# Patient Record
Sex: Female | Born: 1965
Health system: Southern US, Community
[De-identification: ages and names within clinical notes are randomized; demographics above are authoritative.]

## PROBLEM LIST (undated history)

## (undated) DIAGNOSIS — Z8742 Personal history of other diseases of the female genital tract: Secondary | ICD-10-CM

## (undated) DIAGNOSIS — I10 Essential (primary) hypertension: Secondary | ICD-10-CM

## (undated) DIAGNOSIS — E7800 Pure hypercholesterolemia, unspecified: Secondary | ICD-10-CM

## (undated) HISTORY — DX: Personal history of other diseases of the female genital tract: Z87.42

## (undated) HISTORY — DX: Essential (primary) hypertension: I10

## (undated) HISTORY — DX: Pure hypercholesterolemia, unspecified: E78.00

## (undated) HISTORY — PX: COLPOSCOPY: SHX161

---

## 1999-01-25 ENCOUNTER — Other Ambulatory Visit: Admission: RE | Admit: 1999-01-25 | Discharge: 1999-01-25 | Payer: Self-pay | Admitting: Family Medicine

## 2000-03-01 ENCOUNTER — Other Ambulatory Visit: Admission: RE | Admit: 2000-03-01 | Discharge: 2000-03-01 | Payer: Self-pay | Admitting: Gynecology

## 2001-01-29 ENCOUNTER — Other Ambulatory Visit: Admission: RE | Admit: 2001-01-29 | Discharge: 2001-01-29 | Payer: Self-pay | Admitting: Obstetrics and Gynecology

## 2002-02-02 ENCOUNTER — Other Ambulatory Visit: Admission: RE | Admit: 2002-02-02 | Discharge: 2002-02-02 | Payer: Self-pay | Admitting: Obstetrics and Gynecology

## 2003-02-19 ENCOUNTER — Other Ambulatory Visit: Admission: RE | Admit: 2003-02-19 | Discharge: 2003-02-19 | Payer: Self-pay | Admitting: Obstetrics and Gynecology

## 2004-02-22 ENCOUNTER — Other Ambulatory Visit: Admission: RE | Admit: 2004-02-22 | Discharge: 2004-02-22 | Payer: Self-pay | Admitting: Obstetrics and Gynecology

## 2004-08-10 ENCOUNTER — Other Ambulatory Visit: Admission: RE | Admit: 2004-08-10 | Discharge: 2004-08-10 | Payer: Self-pay | Admitting: Obstetrics and Gynecology

## 2005-03-06 ENCOUNTER — Other Ambulatory Visit: Admission: RE | Admit: 2005-03-06 | Discharge: 2005-03-06 | Payer: Self-pay | Admitting: Obstetrics and Gynecology

## 2005-08-30 ENCOUNTER — Other Ambulatory Visit: Admission: RE | Admit: 2005-08-30 | Discharge: 2005-08-30 | Payer: Self-pay | Admitting: Obstetrics and Gynecology

## 2015-08-19 ENCOUNTER — Other Ambulatory Visit: Payer: Self-pay | Admitting: Obstetrics and Gynecology

## 2015-08-19 DIAGNOSIS — R928 Other abnormal and inconclusive findings on diagnostic imaging of breast: Secondary | ICD-10-CM

## 2015-08-29 ENCOUNTER — Ambulatory Visit
Admission: RE | Admit: 2015-08-29 | Discharge: 2015-08-29 | Disposition: A | Discharge status: Home / Self Care | Payer: 59 | Source: Ambulatory Visit | Attending: Obstetrics and Gynecology | Admitting: Obstetrics and Gynecology

## 2015-08-29 DIAGNOSIS — R928 Other abnormal and inconclusive findings on diagnostic imaging of breast: Secondary | ICD-10-CM

## 2018-09-17 ENCOUNTER — Encounter: Payer: Self-pay | Admitting: Obstetrics and Gynecology

## 2018-09-17 ENCOUNTER — Other Ambulatory Visit: Payer: Self-pay | Admitting: Obstetrics and Gynecology

## 2018-09-17 DIAGNOSIS — Z1231 Encounter for screening mammogram for malignant neoplasm of breast: Secondary | ICD-10-CM

## 2018-10-07 ENCOUNTER — Other Ambulatory Visit (HOSPITAL_COMMUNITY)
Admission: RE | Admit: 2018-10-07 | Discharge: 2018-10-07 | Disposition: A | Discharge status: Home / Self Care | Payer: 59 | Source: Ambulatory Visit | Attending: Obstetrics and Gynecology | Admitting: Obstetrics and Gynecology

## 2018-10-07 ENCOUNTER — Encounter: Payer: Self-pay | Admitting: Obstetrics and Gynecology

## 2018-10-07 ENCOUNTER — Other Ambulatory Visit: Payer: Self-pay

## 2018-10-07 ENCOUNTER — Ambulatory Visit (INDEPENDENT_AMBULATORY_CARE_PROVIDER_SITE_OTHER): Payer: 59 | Admitting: Obstetrics and Gynecology

## 2018-10-07 VITALS — BP 124/80 | HR 84 | Resp 16 | Ht 65.0 in | Wt 276.0 lb

## 2018-10-07 DIAGNOSIS — Z01419 Encounter for gynecological examination (general) (routine) without abnormal findings: Secondary | ICD-10-CM | POA: Insufficient documentation

## 2018-10-07 DIAGNOSIS — N951 Menopausal and female climacteric states: Secondary | ICD-10-CM | POA: Diagnosis not present

## 2018-10-07 DIAGNOSIS — Z113 Encounter for screening for infections with a predominantly sexual mode of transmission: Secondary | ICD-10-CM | POA: Insufficient documentation

## 2018-10-07 DIAGNOSIS — R928 Other abnormal and inconclusive findings on diagnostic imaging of breast: Secondary | ICD-10-CM

## 2018-10-07 DIAGNOSIS — Z23 Encounter for immunization: Secondary | ICD-10-CM

## 2018-10-07 DIAGNOSIS — E785 Hyperlipidemia, unspecified: Secondary | ICD-10-CM

## 2018-10-07 NOTE — Patient Instructions (Signed)

## 2018-10-07 NOTE — Progress Notes (Signed)
52 y.o. G0P0000 Single African American female here as a prior patient of Dr. Edward JollySilva for an annual exam.    LMP 07/13/18. Prior LMP was July 2019.  Some hot flashes.  Essential oils help this.  Would like STD screening.  RLQ pain at hs, not during the day.  Not associated with anything.  Normal bowel movements.   PCP:  No PCP.  Patient's last menstrual period was 07/13/2018.     Period Cycle (Days): (no period since September 2019) Period Duration (Days): 4 Period Pattern: (!) Irregular Menstrual Flow: Light, Moderate Menstrual Control: Maxi pad Menstrual Control Change Freq (Hours): 2-3 Dysmenorrhea: None     Sexually active: Yes.    The current method of family planning is none.    Exercising: Yes.    cardio Smoker:  no  Health Maintenance: Pap:  09/17/17 Normal History of abnormal Pap:  Yes, per patient about 3 years ago with Dr. Claiborne Billingsallahan [facility]; had Colposcopy done, normal since MMG:  09[place] per patient due.    She was due for a follow up mammogram in June, 2019 as a dx follow up, and she did not do this.   Colonoscopy:  About 3 years ago per patient polyps removed, f/u 10 years - Dr. Loreta AveMann BMD:   n/a  Result  n/a TDaP:  Unsure possibly more than 10 years.   Gardasil:   no HIV: negative in the past Hep C: never Screening Labs:  Discuss today    reports that she has never smoked. She has never used smokeless tobacco. She reports that she drinks alcohol. She reports that she does not use drugs.  Past Medical History:  Diagnosis Date  . High cholesterol   . History of abnormal cervical Pap smear   . Hypertension     Past Surgical History:  Procedure Laterality Date  . COLPOSCOPY      Current Outpatient Medications  Medication Sig Dispense Refill  . amLODIPine Besylate (NORVASC PO) Take 5 mg by mouth daily.    . naproxen (NAPROSYN) 500 MG tablet naproxen 500 mg tablet  Take 1 tablet twice a day by oral route as needed.      No current facility-administered medications for this visit.     Family History  Problem Relation Age of Onset  . Hyperlipidemia Mother   . Hypertension Mother   . Congestive Heart Failure Father   . Autoimmune disease Sister   . Hypertension Brother   . Dementia Maternal Grandmother   . Colon cancer Maternal Grandfather   . Hypertension Brother   . Hyperlipidemia Brother     Review of Systems  Constitutional: Negative.   HENT: Negative.   Eyes: Negative.   Respiratory: Negative.   Cardiovascular: Negative.   Gastrointestinal: Negative.   Endocrine: Negative.   Genitourinary: Negative.   Musculoskeletal: Negative.   Skin: Negative.   Allergic/Immunologic: Negative.   Neurological: Negative.   Hematological: Negative.   Psychiatric/Behavioral: Negative.     Exam:   BP 124/80 (BP Location: Right Arm, Patient Position: Sitting, Cuff Size: Large)   Pulse 84   Resp 16   Ht 5\' 5"  (1.651 m)   Wt 276 lb (125.2 kg)   LMP 07/13/2018   BMI 45.93 kg/m     General appearance: alert, cooperative and appears stated age Head: Normocephalic, without obvious abnormality, atraumatic Neck: no adenopathy, supple, symmetrical, trachea midline and thyroid normal to inspection and palpation Lungs: clear to auscultation bilaterally Breasts:  normal appearance, no masses or tenderness, No nipple retraction or dimpling, No nipple discharge or bleeding, No axillary or supraclavicular adenopathy Heart: regular rate and rhythm Abdomen: soft, non-tender; no masses, no organomegaly Extremities: extremities normal, atraumatic, no cyanosis or edema Skin: Skin color, texture, turgor normal. No rashes or lesions Lymph nodes: Cervical, supraclavicular, and axillary nodes normal. No abnormal inguinal nodes palpated Neurologic: Grossly normal  Pelvic: External genitalia:  no lesions              Urethra:  normal appearing urethra with no masses, tenderness or lesions              Bartholins and  Skenes: normal                 Vagina: normal appearing vagina with normal color and discharge, no lesions              Cervix: no lesions              Pap taken: Yes.   Bimanual Exam:  Uterus:  normal size, contour, position, consistency, mobility, non-tender              Adnexa: no mass, fullness, tenderness              Rectal exam: Yes.  .  Confirms.              Anus:  normal sphincter tone, no lesions  Chaperone was present for exam.  Assessment:   Well woman visit with normal exam. Hx abnormal pap.  Menopausal symptoms. STD screening.  Hx abnormal mammogram in [place], overdue for follow up.   Plan: Mammogram screening.  May need dx imaging at [facility].  Recommended self breast awareness. Pap and HR HPV as above. Guidelines for Calcium, Vitamin D, regular exercise program including cardiovascular and weight bearing exercise. TDap. Flu vaccine discussed.  Routine labs and STD screening.  She will establish care with PCP.  She is going to need refills of her meds in the next couple of months. Follow up annually and prn.   After visit summary provided.

## 2018-10-07 NOTE — Progress Notes (Signed)
Opened in error

## 2018-10-08 LAB — HEPATITIS C ANTIBODY: Hep C Virus Ab: <0.1 s/co ratio (ref 0.0–0.9)

## 2018-10-08 LAB — LIPID PANEL
Chol/HDL Ratio: 4.5 ratio — ABNORMAL HIGH (ref 0.0–4.4)
Cholesterol, Total: 300 mg/dL — ABNORMAL HIGH (ref 100–199)
HDL: 67 mg/dL (ref >39)
LDL Calculated: 220 mg/dL — ABNORMAL HIGH (ref 0–99)
Triglycerides: 64 mg/dL (ref 0–149)
VLDL Cholesterol Cal: 13 mg/dL (ref 5–40)

## 2018-10-08 LAB — COMPREHENSIVE METABOLIC PANEL
ALT: 7 IU/L (ref 0–32)
AST: 15 IU/L (ref 0–40)
Albumin/Globulin Ratio: 1.4 (ref 1.2–2.2)
Albumin: 4.3 g/dL (ref 3.5–5.5)
Alkaline Phosphatase: 103 IU/L (ref 39–117)
BUN/Creatinine Ratio: 10 (ref 9–23)
BUN: 9 mg/dL (ref 6–24)
Bilirubin Total: 0.5 mg/dL (ref 0.0–1.2)
CO2: 21 mmol/L (ref 20–29)
Calcium: 9.5 mg/dL (ref 8.7–10.2)
Chloride: 105 mmol/L (ref 96–106)
Creatinine, Ser: 0.89 mg/dL (ref 0.57–1.00)
GFR calc Af Amer: 86 mL/min/{1.73_m2} (ref >59)
GFR calc non Af Amer: 74 mL/min/{1.73_m2} (ref >59)
Globulin, Total: 3.1 g/dL (ref 1.5–4.5)
Glucose: 89 mg/dL (ref 65–99)
Potassium: 4.2 mmol/L (ref 3.5–5.2)
Sodium: 141 mmol/L (ref 134–144)
Total Protein: 7.4 g/dL (ref 6.0–8.5)

## 2018-10-08 LAB — CBC
Hematocrit: 34.5 % (ref 34.0–46.6)
Hemoglobin: 11.5 g/dL (ref 11.1–15.9)
MCH: 28 pg (ref 26.6–33.0)
MCHC: 33.3 g/dL (ref 31.5–35.7)
MCV: 84 fL (ref 79–97)
Platelets: 274 10*3/uL (ref 150–450)
RBC: 4.1 x10E6/uL (ref 3.77–5.28)
RDW: 14.1 % (ref 12.3–15.4)
WBC: 5.2 10*3/uL (ref 3.4–10.8)

## 2018-10-08 LAB — ESTRADIOL: Estradiol: 19.1 pg/mL

## 2018-10-08 LAB — HEP, RPR, HIV PANEL
HIV Screen 4th Generation wRfx: NONREACTIVE
Hepatitis B Surface Ag: NEGATIVE
RPR Ser Ql: NONREACTIVE

## 2018-10-08 LAB — TSH: TSH: 1.44 u[IU]/mL (ref 0.450–4.500)

## 2018-10-08 LAB — FOLLICLE STIMULATING HORMONE: FSH: 48.2 m[IU]/mL

## 2018-10-09 ENCOUNTER — Telehealth: Payer: Self-pay | Admitting: *Deleted

## 2018-10-09 DIAGNOSIS — E785 Hyperlipidemia, unspecified: Secondary | ICD-10-CM | POA: Insufficient documentation

## 2018-10-09 NOTE — Telephone Encounter (Signed)
-----   Message from Patton SallesBrook E Amundson C Silva, MD sent at 10/09/2018  2:29 PM EST ----- Please contact patient with results from her lab work.   Her hormonal testing indicates menopausal hormonal levels.  Her last menstrual cycle was September, 2019.  Please have her call if she has future bleeding.   Her cholesterol level is quite high.  Her total cholesterol is 300 and her LDL cholesterol, the bad cholesterol, is 220.  She is at increased risk of cardiovascular disease, and needs to follow up with a PCP.  I would like for us to make a referral for her to a group of her choice.  If she does not have a preference, please refer her to a [facility] group so they can see the results here.   Her metabolic profile, CBC, and TSH are all normal.   Testing is negative for HIV, syphilis, and hep B and C.   Her pap and cervical infection check are not back yet.

## 2018-10-09 NOTE — Telephone Encounter (Signed)
Notes recorded by Leda MinHamm, Vangie Henthorn N, RN on 10/09/2018 at 3:16 PM EST Left message to call Noreene LarssonJill, RN at [facility] [phone].

## 2018-10-10 LAB — CYTOLOGY - PAP
Chlamydia: NEGATIVE
Diagnosis: NEGATIVE
HPV: NOT DETECTED
Neisseria Gonorrhea: NEGATIVE
Trichomonas: NEGATIVE

## 2018-10-14 NOTE — Telephone Encounter (Signed)
Left message to call Noreene LarssonJill, RN at [facility] [phone].   See all results notes below

## 2018-10-14 NOTE — Telephone Encounter (Signed)
Notes recorded by Patton SallesAmundson C Silva, Brook E, MD on 10/13/2018 at 8:54 PM EST Please inform patient of her pap and STD testing.  Her pap is normal and she has a negative HR HPV result. Pap recall - 02.  Testing is negative for gonorrhea, chlamydia, and trichomonas.

## 2018-10-14 NOTE — Telephone Encounter (Signed)
Spoke with patient, advised of all results as seen below per Dr. Edward JollySilva. Patient is scheduled for OV today to establish care with PCP/ [facility]ana Collins at [facility]. Copy of labs dated 10/07/18 faxed via Epic to Dr. Thomasena Edisollins. Patient verbalizes understanding and is agreeable.   Next AEX 10/16/19  Encounter closed.

## 2018-11-07 ENCOUNTER — Other Ambulatory Visit: Payer: 59

## 2018-11-13 ENCOUNTER — Other Ambulatory Visit: Payer: 59

## 2018-11-27 ENCOUNTER — Ambulatory Visit: Payer: 59

## 2018-11-27 ENCOUNTER — Ambulatory Visit
Admission: RE | Admit: 2018-11-27 | Discharge: 2018-11-27 | Disposition: A | Discharge status: Home / Self Care | Payer: 59 | Source: Ambulatory Visit | Attending: Obstetrics and Gynecology | Admitting: Obstetrics and Gynecology

## 2018-11-27 DIAGNOSIS — R928 Other abnormal and inconclusive findings on diagnostic imaging of breast: Secondary | ICD-10-CM

## 2018-12-22 ENCOUNTER — Telehealth: Payer: Self-pay | Admitting: Obstetrics and Gynecology

## 2018-12-22 NOTE — Telephone Encounter (Signed)
Return call from patient. Has concerns over coding for mammogram.  Advised will contact [facility] on her behalf and have business office call her back.   Encounter closed.

## 2018-12-22 NOTE — Telephone Encounter (Signed)
Patient has questions about her mammogram which was coded as diagnostic.

## 2019-08-25 ENCOUNTER — Other Ambulatory Visit: Payer: Self-pay | Admitting: Obstetrics and Gynecology

## 2019-08-25 DIAGNOSIS — Z1231 Encounter for screening mammogram for malignant neoplasm of breast: Secondary | ICD-10-CM

## 2019-10-13 NOTE — Progress Notes (Signed)
53 y.o. G0P0000 Single African American female here for annual exam.    Hot flashes once in a while, but not recently.  Had a light cycle in February, and then a normal period in August, 2020.   She would like STD screening.   Working from home since prior to the pandemic.   PCP:   Irena Reichmann   LMP 05/30/2019           Sexually active: No.  The current method of family planning is none.    Exercising: Yes.    Walking Smoker:  no  Health Maintenance: Pap:  09/17/17 Normal  History of abnormal Pap:  yes per patient about 3 years ago with Dr. Claiborne Billings at [facility]; had Colposcopy done, normal since MMG:  11/27/18 Birads 1 neg  Colonoscopy:  About 3 years ago per patient polyps removed, f/u 10 years - Dr. Loreta Ave BMD:   NA  Result  NA TDaP:  10/07/2018 Gardasil:   no HIV:Neg in the past  Hep C: never Screening Labs:  Hb today: PCP, Urine today: none collected  Flu vaccine:  Recommended.    reports that she has never smoked. She has never used smokeless tobacco. She reports current alcohol use. She reports that she does not use drugs.  Past Medical History:  Diagnosis Date  . High cholesterol   . History of abnormal cervical Pap smear   . Hypertension     Past Surgical History:  Procedure Laterality Date  . COLPOSCOPY      Current Outpatient Medications  Medication Sig Dispense Refill  . amLODIPine Besylate (NORVASC PO) Take 5 mg by mouth daily.    . naproxen (NAPROSYN) 500 MG tablet naproxen 500 mg tablet  Take 1 tablet twice a day by oral route as needed.    . medroxyPROGESTERone (PROVERA) 10 MG tablet Take 1 tablet (10 mg total) by mouth daily. 10 tablet 0   No current facility-administered medications for this visit.    Family History  Problem Relation Age of Onset  . Hyperlipidemia Mother   . Hypertension Mother   . Congestive Heart Failure Father   . Autoimmune disease Sister   . Hypertension Brother   . Dementia Maternal Grandmother   . Colon  cancer Maternal Grandfather   . Hypertension Brother   . Hyperlipidemia Brother     Review of Systems  All other systems reviewed and are negative.   Exam:   BP 128/70   Pulse 78   Temp (!) 97.2 F (36.2 C)   Ht 5' 5.5" (1.664 m)   Wt 286 lb 6.4 oz (129.9 kg)   LMP 05/30/2019 (Approximate)   SpO2 98%   BMI 46.93 kg/m     General appearance: alert, cooperative and appears stated age Head: normocephalic, without obvious abnormality, atraumatic Neck: no adenopathy, supple, symmetrical, trachea midline and thyroid normal to inspection and palpation Lungs: clear to auscultation bilaterally Breasts: normal appearance, no masses or tenderness, No nipple retraction or dimpling, No nipple discharge or bleeding, No axillary adenopathy Heart: regular rate and rhythm Abdomen: soft, non-tender; no masses, no organomegaly Extremities: extremities normal, atraumatic, no cyanosis or edema Skin: skin color, texture, turgor normal. No rashes or lesions Lymph nodes: cervical, supraclavicular, and axillary nodes normal. Neurologic: grossly normal  Pelvic: External genitalia:  no lesions              No abnormal inguinal nodes palpated.  Urethra:  normal appearing urethra with no masses, tenderness or lesions              Bartholins and Skenes: normal                 Vagina: normal appearing vagina with normal color and discharge, no lesions              Cervix: no lesions              Pap taken: Yes.   Bimanual Exam:  Uterus:  normal size, contour, position, consistency, mobility, non-tender              Adnexa: no mass, fullness, tenderness              Rectal exam: Yes.  .  Confirms.              Anus:  normal sphincter tone, no lesions  Chaperone was present for exam.  Assessment:   Well woman visit with normal exam. Hx abnormal pap.  Perimenopausal female. Elevated cholesterol.  Plan: Mammogram screening discussed. Self breast awareness reviewed. Pap and HR HPV as  above. Guidelines for Calcium, Vitamin D, regular exercise program including cardiovascular and weight bearing exercise. Provera 10 mg x 10 days.  Discussed rationale. STD screening.  Routine labs with PCP.  Follow up annually and prn.   After visit summary provided.

## 2019-10-14 ENCOUNTER — Other Ambulatory Visit: Payer: Self-pay

## 2019-10-16 ENCOUNTER — Encounter: Payer: Self-pay | Admitting: Obstetrics and Gynecology

## 2019-10-16 ENCOUNTER — Other Ambulatory Visit (HOSPITAL_COMMUNITY)
Admission: RE | Admit: 2019-10-16 | Discharge: 2019-10-16 | Disposition: A | Discharge status: Home / Self Care | Payer: 59 | Source: Ambulatory Visit | Attending: Obstetrics and Gynecology | Admitting: Obstetrics and Gynecology

## 2019-10-16 ENCOUNTER — Other Ambulatory Visit: Payer: Self-pay

## 2019-10-16 ENCOUNTER — Ambulatory Visit (INDEPENDENT_AMBULATORY_CARE_PROVIDER_SITE_OTHER): Payer: 59 | Admitting: Obstetrics and Gynecology

## 2019-10-16 ENCOUNTER — Encounter (INDEPENDENT_AMBULATORY_CARE_PROVIDER_SITE_OTHER): Payer: Self-pay

## 2019-10-16 VITALS — BP 128/70 | HR 78 | Temp 97.2°F | Ht 65.5 in | Wt 286.4 lb

## 2019-10-16 DIAGNOSIS — Z01419 Encounter for gynecological examination (general) (routine) without abnormal findings: Secondary | ICD-10-CM | POA: Insufficient documentation

## 2019-10-16 DIAGNOSIS — Z113 Encounter for screening for infections with a predominantly sexual mode of transmission: Secondary | ICD-10-CM | POA: Insufficient documentation

## 2019-10-16 MED ORDER — MEDROXYPROGESTERONE ACETATE 10 MG PO TABS: 10.0000 mg | ORAL_TABLET | Freq: Every day | ORAL | 0 refills | Status: DC

## 2019-10-16 NOTE — Patient Instructions (Signed)

## 2019-10-17 LAB — HEPATITIS C ANTIBODY: Hep C Virus Ab: <0.1 s/co ratio (ref 0.0–0.9)

## 2019-10-17 LAB — HEP, RPR, HIV PANEL
HIV Screen 4th Generation wRfx: NONREACTIVE
Hepatitis B Surface Ag: NEGATIVE
RPR Ser Ql: NONREACTIVE

## 2019-10-20 LAB — CYTOLOGY - PAP
Adequacy: ABSENT
Chlamydia: NEGATIVE
Comment: NEGATIVE
Comment: NEGATIVE
Comment: NEGATIVE
Comment: NORMAL
Diagnosis: NEGATIVE
High risk HPV: NEGATIVE
Neisseria Gonorrhea: NEGATIVE
Trichomonas: NEGATIVE

## 2019-10-21 ENCOUNTER — Telehealth: Payer: Self-pay

## 2019-10-21 NOTE — Telephone Encounter (Signed)
-----   Message from Nunzio Cobbs, MD sent at 10/21/2019  9:49 AM EST ----- Please contact patient with results of testing. Her pap is normal and she has a negative HR HPV result. She tested negative for gonorrhea, chlamydia, and trichomonas.  Please place her in 36 month recall.  She had a history of an abnormal pap in recent years at an outside office and had a normal colposcopy result.   I am highlighting this result so you know to contact the patient.

## 2019-10-21 NOTE — Telephone Encounter (Signed)
Called patient and per DPR, left detailed message stating pap & HR HPV normal/neg. Also stated all labs were completely normal. Advised to call office if she had any questions.  36 recall entered

## 2019-11-30 ENCOUNTER — Ambulatory Visit
Admission: RE | Admit: 2019-11-30 | Discharge: 2019-11-30 | Disposition: A | Discharge status: Home / Self Care | Payer: 59 | Source: Ambulatory Visit | Attending: Obstetrics and Gynecology | Admitting: Obstetrics and Gynecology

## 2019-11-30 ENCOUNTER — Other Ambulatory Visit: Payer: Self-pay

## 2019-11-30 DIAGNOSIS — Z1231 Encounter for screening mammogram for malignant neoplasm of breast: Secondary | ICD-10-CM

## 2019-12-31 ENCOUNTER — Ambulatory Visit: Payer: 59 | Attending: Family

## 2019-12-31 ENCOUNTER — Other Ambulatory Visit: Payer: Self-pay

## 2019-12-31 DIAGNOSIS — Z23 Encounter for immunization: Secondary | ICD-10-CM | POA: Insufficient documentation

## 2019-12-31 NOTE — Progress Notes (Signed)
   Covid-19 Vaccination Clinic  Name:  Anna Porter    MRN: [id number] DOB: 1966-03-29  12/31/2019  [name] was observed post Covid-19 immunization for 15 minutes without incident. She was provided with Vaccine Information Sheet and instruction to access the V-Safe system.   [name] was instructed to call 911 with any severe reactions post vaccine: Marland Kitchen Difficulty breathing  . Swelling of face and throat  . A fast heartbeat  . A bad rash all over body  . Dizziness and weakness   Immunizations Administered    Name Date Dose VIS Date Route   Moderna COVID-19 Vaccine 12/31/2019 11:26 AM 0.5 mL 09/29/2019 Intramuscular   Manufacturer: Moderna   Lot: [id number]   NDC: 94801-655-37

## 2020-02-02 ENCOUNTER — Ambulatory Visit: Payer: 59 | Attending: Family

## 2020-02-02 DIAGNOSIS — Z23 Encounter for immunization: Secondary | ICD-10-CM

## 2020-02-02 NOTE — Progress Notes (Signed)
   Covid-19 Vaccination Clinic  Name:  Anna Porter    MRN: [id number] DOB: 12/05/1965  02/02/2020  Anna Porter was observed post Covid-19 immunization for 15 minutes without incident. She was provided with Vaccine Information Sheet and instruction to access the V-Safe system.   Anna Porter was instructed to call 911 with any severe reactions post vaccine: Marland Kitchen Difficulty breathing  . Swelling of face and throat  . A fast heartbeat  . A bad rash all over body  . Dizziness and weakness   Immunizations Administered    Name Date Dose VIS Date Route   Moderna COVID-19 Vaccine 02/02/2020 11:42 AM 0.5 mL 09/29/2019 Intramuscular   Manufacturer: Moderna   Lot: [id number]   NDC: 80321-224-82

## 2020-08-30 ENCOUNTER — Ambulatory Visit: Payer: 59 | Attending: Internal Medicine

## 2020-08-30 DIAGNOSIS — Z23 Encounter for immunization: Secondary | ICD-10-CM

## 2020-08-30 NOTE — Progress Notes (Signed)
   Covid-19 Vaccination Clinic  Name:  Anna Porter    MRN: [id number] DOB: 15-Mar-1966  08/30/2020  Anna Porter was observed post Covid-19 immunization for 15 minutes without incident. She was provided with Vaccine Information Sheet and instruction to access the V-Safe system.   Anna Porter was instructed to call 911 with any severe reactions post vaccine: Marland Kitchen Difficulty breathing  . Swelling of face and throat  . A fast heartbeat  . A bad rash all over body  . Dizziness and weakness

## 2020-11-01 ENCOUNTER — Ambulatory Visit: Payer: 59 | Admitting: Obstetrics and Gynecology

## 2020-11-23 DIAGNOSIS — Z6841 Body Mass Index (BMI) 40.0 and over, adult: Secondary | ICD-10-CM | POA: Diagnosis not present

## 2020-11-23 DIAGNOSIS — R7309 Other abnormal glucose: Secondary | ICD-10-CM | POA: Diagnosis not present

## 2020-11-23 DIAGNOSIS — E0590 Thyrotoxicosis, unspecified without thyrotoxic crisis or storm: Secondary | ICD-10-CM | POA: Diagnosis not present

## 2020-11-23 DIAGNOSIS — I10 Essential (primary) hypertension: Secondary | ICD-10-CM | POA: Diagnosis not present

## 2021-01-12 ENCOUNTER — Ambulatory Visit: Payer: 59 | Admitting: Obstetrics and Gynecology

## 2021-01-17 ENCOUNTER — Encounter (HOSPITAL_COMMUNITY): Payer: Self-pay

## 2021-01-17 ENCOUNTER — Emergency Department (HOSPITAL_COMMUNITY): Payer: 59

## 2021-01-17 ENCOUNTER — Emergency Department (HOSPITAL_COMMUNITY)
Admission: EM | Admit: 2021-01-17 | Discharge: 2021-01-17 | Disposition: A | Discharge status: Home / Self Care | Payer: 59 | Attending: Emergency Medicine | Admitting: Emergency Medicine

## 2021-01-17 ENCOUNTER — Other Ambulatory Visit: Payer: Self-pay

## 2021-01-17 DIAGNOSIS — R0781 Pleurodynia: Secondary | ICD-10-CM | POA: Diagnosis not present

## 2021-01-17 DIAGNOSIS — I10 Essential (primary) hypertension: Secondary | ICD-10-CM | POA: Insufficient documentation

## 2021-01-17 DIAGNOSIS — Z79899 Other long term (current) drug therapy: Secondary | ICD-10-CM | POA: Diagnosis not present

## 2021-01-17 DIAGNOSIS — R000 Tachycardia, unspecified: Secondary | ICD-10-CM | POA: Insufficient documentation

## 2021-01-17 DIAGNOSIS — Y92481 Parking lot as the place of occurrence of the external cause: Secondary | ICD-10-CM | POA: Diagnosis not present

## 2021-01-17 MED ORDER — IBUPROFEN 800 MG PO TABS: 800.0000 mg | ORAL_TABLET | Freq: Three times a day (TID) | ORAL | 0 refills | Status: DC

## 2021-01-17 MED ORDER — CYCLOBENZAPRINE HCL 10 MG PO TABS: 10.0000 mg | ORAL_TABLET | Freq: Three times a day (TID) | ORAL | 0 refills | Status: DC | PRN
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
## 2021-01-17 NOTE — ED Notes (Signed)
Patient transported to X-ray 
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
## 2021-01-19 ENCOUNTER — Ambulatory Visit: Payer: 59 | Admitting: Obstetrics and Gynecology

## 2021-01-20 ENCOUNTER — Encounter (HOSPITAL_COMMUNITY): Payer: Self-pay

## 2021-01-20 ENCOUNTER — Other Ambulatory Visit: Payer: Self-pay

## 2021-01-20 ENCOUNTER — Emergency Department (HOSPITAL_COMMUNITY): Payer: 59

## 2021-01-20 ENCOUNTER — Emergency Department (HOSPITAL_COMMUNITY)
Admission: EM | Admit: 2021-01-20 | Discharge: 2021-01-20 | Disposition: A | Discharge status: Home / Self Care | Payer: 59 | Attending: Emergency Medicine | Admitting: Emergency Medicine

## 2021-01-20 DIAGNOSIS — I10 Essential (primary) hypertension: Secondary | ICD-10-CM | POA: Insufficient documentation

## 2021-01-20 DIAGNOSIS — Z79899 Other long term (current) drug therapy: Secondary | ICD-10-CM | POA: Diagnosis not present

## 2021-01-20 DIAGNOSIS — R42 Dizziness and giddiness: Secondary | ICD-10-CM | POA: Insufficient documentation

## 2021-01-20 LAB — CBG MONITORING, ED: Glucose-Capillary: 93 mg/dL (ref 70–99)

## 2021-01-20 LAB — PREGNANCY, URINE: Preg Test, Ur: NEGATIVE

## 2021-01-20 LAB — I-STAT BETA HCG BLOOD, ED (MC, WL, AP ONLY): I-stat hCG, quantitative: 7.1 m[IU]/mL — ABNORMAL HIGH (ref <5)

## 2021-01-20 LAB — CBC
HCT: 36.2 % (ref 36.0–46.0)
Hemoglobin: 11.8 g/dL — ABNORMAL LOW (ref 12.0–15.0)
MCH: 27.1 pg (ref 26.0–34.0)
MCHC: 32.6 g/dL (ref 30.0–36.0)
MCV: 83 fL (ref 80.0–100.0)
Platelets: 291 10*3/uL (ref 150–400)
RBC: 4.36 MIL/uL (ref 3.87–5.11)
RDW: 13.9 % (ref 11.5–15.5)
WBC: 8.3 10*3/uL (ref 4.0–10.5)
nRBC: 0 % (ref 0.0–0.2)

## 2021-01-20 LAB — BASIC METABOLIC PANEL
Anion gap: 11 (ref 5–15)
BUN: 13 mg/dL (ref 6–20)
CO2: 23 mmol/L (ref 22–32)
Calcium: 9.6 mg/dL (ref 8.9–10.3)
Chloride: 106 mmol/L (ref 98–111)
Creatinine, Ser: 0.86 mg/dL (ref 0.44–1.00)
GFR, Estimated: >60 mL/min (ref >60)
Glucose, Bld: 100 mg/dL — ABNORMAL HIGH (ref 70–99)
Potassium: 4.2 mmol/L (ref 3.5–5.1)
Sodium: 140 mmol/L (ref 135–145)

## 2021-01-20 LAB — URINALYSIS, ROUTINE W REFLEX MICROSCOPIC
Bilirubin Urine: NEGATIVE
Glucose, UA: NEGATIVE mg/dL
Ketones, ur: NEGATIVE mg/dL
Leukocytes,Ua: NEGATIVE
Nitrite: NEGATIVE
Protein, ur: NEGATIVE mg/dL
Specific Gravity, Urine: 1.004 — ABNORMAL LOW (ref 1.005–1.030)
pH: 7 (ref 5.0–8.0)

## 2021-01-20 MED ORDER — MECLIZINE HCL 25 MG PO TABS
25.0000 mg | ORAL_TABLET | Freq: Once | ORAL | Status: AC
  Administered 2021-01-20: 25 mg via ORAL
  Filled 2021-01-20: qty 1

## 2021-01-20 MED ORDER — IOHEXOL 350 MG/ML SOLN
100.0000 mL | Freq: Once | INTRAVENOUS | Status: AC | PRN
  Administered 2021-01-20: 100 mL via INTRAVENOUS

## 2021-01-20 MED ORDER — MECLIZINE HCL 25 MG PO TABS: 25.0000 mg | ORAL_TABLET | Freq: Three times a day (TID) | ORAL | 0 refills | Status: DC | PRN
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
## 2021-01-20 NOTE — ED Triage Notes (Addendum)
Patient reports that when she laid done last night she had dizziness and when she turned over in the bed this AM she felt dizzy. Patient states after showering she had to hold onto objects to get back to the bed this AM.  Patient denies feeling near syncope, chest pain, or SOB.  BP in triage-152/98. Patient state she take Amlodipine at night and had it last night.

## 2021-01-20 NOTE — ED Notes (Signed)
Pt ambulatory to bathroom, steady gait

## 2021-01-20 NOTE — Discharge Instructions (Addendum)
Your imaging and work-up today was reassuring.  This is likely due to benign positional vertigo.  Do the exercises provided in your paperwork today and use meclizine as needed.  Follow-up with your PCP and ENT.  Return for new or worsening symptoms such as persistent dizziness that does not improve when you are still, visual changes, headache, numbness, weakness.

## 2021-02-07 ENCOUNTER — Other Ambulatory Visit: Payer: Self-pay | Admitting: Obstetrics and Gynecology

## 2021-02-07 DIAGNOSIS — Z1231 Encounter for screening mammogram for malignant neoplasm of breast: Secondary | ICD-10-CM

## 2021-02-09 ENCOUNTER — Ambulatory Visit: Payer: 59 | Admitting: Obstetrics and Gynecology

## 2021-02-09 ENCOUNTER — Encounter: Payer: Self-pay | Admitting: Obstetrics and Gynecology

## 2021-02-09 ENCOUNTER — Other Ambulatory Visit: Payer: Self-pay

## 2021-02-09 VITALS — BP 122/70 | HR 83 | Ht 65.0 in | Wt 279.0 lb

## 2021-02-09 DIAGNOSIS — N951 Menopausal and female climacteric states: Secondary | ICD-10-CM | POA: Diagnosis not present

## 2021-02-09 DIAGNOSIS — Z01419 Encounter for gynecological examination (general) (routine) without abnormal findings: Secondary | ICD-10-CM | POA: Diagnosis not present
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
## 2021-02-09 NOTE — Patient Instructions (Signed)

## 2021-02-17 ENCOUNTER — Telehealth: Payer: Self-pay | Admitting: *Deleted

## 2021-02-17 ENCOUNTER — Other Ambulatory Visit: Payer: Self-pay | Admitting: Obstetrics and Gynecology

## 2021-02-17 NOTE — Telephone Encounter (Signed)
Patient called and left message in triage stating she arrived to [facility] for labs from [facility] on 02/09/21 and they did not have the labs. She provided me with fax # [phone] to fax orders. I called patient and left detailed message on voicemail order faxed.

## 2021-02-18 LAB — ESTRADIOL: Estradiol: 14.3 pg/mL

## 2021-02-18 LAB — FOLLICLE STIMULATING HORMONE: FSH: 62.3 m[IU]/mL
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
## 2021-02-24 ENCOUNTER — Ambulatory Visit: Payer: 59 | Attending: Otolaryngology

## 2021-02-24 ENCOUNTER — Other Ambulatory Visit: Payer: Self-pay

## 2021-02-24 DIAGNOSIS — R42 Dizziness and giddiness: Secondary | ICD-10-CM | POA: Diagnosis present

## 2021-02-24 DIAGNOSIS — R2681 Unsteadiness on feet: Secondary | ICD-10-CM | POA: Diagnosis present

## 2021-02-24 NOTE — Patient Instructions (Signed)
Sit to Side-Lying    Sit on edge of bed. 1. Turn head 45 to right. 2. Maintain head position and lie down slowly on left side. Hold until symptoms subside. 3. Sit up slowly. Hold until symptoms subside. 4. Turn head 45 to left. 5. Maintain head position and lie down slowly on right side. Hold until symptoms subside. 6. Sit up slowly. Repeat sequence 5 times per session. Do 1 sessions per day.  Copyright  VHI. All rights reserved.

## 2021-03-22 ENCOUNTER — Other Ambulatory Visit: Payer: Self-pay | Admitting: Physical Medicine and Rehabilitation

## 2021-03-22 DIAGNOSIS — Z8349 Family history of other endocrine, nutritional and metabolic diseases: Secondary | ICD-10-CM

## 2021-03-22 DIAGNOSIS — E049 Nontoxic goiter, unspecified: Secondary | ICD-10-CM

## 2021-03-30 ENCOUNTER — Ambulatory Visit
Admission: RE | Admit: 2021-03-30 | Discharge: 2021-03-30 | Disposition: A | Discharge status: Home / Self Care | Payer: 59 | Source: Ambulatory Visit | Attending: Obstetrics and Gynecology | Admitting: Obstetrics and Gynecology

## 2021-03-30 ENCOUNTER — Other Ambulatory Visit: Payer: Self-pay

## 2021-03-30 DIAGNOSIS — Z1231 Encounter for screening mammogram for malignant neoplasm of breast: Secondary | ICD-10-CM

## 2021-04-05 ENCOUNTER — Ambulatory Visit
Admission: RE | Admit: 2021-04-05 | Discharge: 2021-04-05 | Disposition: A | Discharge status: Home / Self Care | Payer: 59 | Source: Ambulatory Visit | Attending: Physical Medicine and Rehabilitation | Admitting: Physical Medicine and Rehabilitation

## 2021-04-05 DIAGNOSIS — E049 Nontoxic goiter, unspecified: Secondary | ICD-10-CM

## 2021-04-05 DIAGNOSIS — Z8349 Family history of other endocrine, nutritional and metabolic diseases: Secondary | ICD-10-CM

## 2021-07-04 ENCOUNTER — Encounter: Payer: Self-pay | Admitting: Internal Medicine

## 2021-07-04 NOTE — Progress Notes (Signed)
Patient ID: Anna Porter, female   DOB: 1966-02-06, 55 y.o.   MRN: [id number]  This visit occurred during the SARS-CoV-2 public health emergency.  Safety protocols were in place, including screening questions prior to the visit, additional usage of staff PPE, and extensive cleaning of exam room while observing appropriate contact time as indicated for disinfecting solutions.   HPI  Anna Porter is a 55 y.o.-year-old female, referred by her PCP, Dr. Janie Morning, for evaluation for thyroid nodules, Hashimoto's thyroiditis, and thyrotoxicosis.  She was found to have a suppressed TSH in 07/2020 >> referred to endocrinology (Dr. Garnet Koyanagi) >> dx'ed Graves ds. >> started Methimazole 08/2020 >> developed rash >> stopped 09/2020.   In 12/2020 started to go to [facility] >> in 03/30/2021 started Liothyronine 5 mcg daily for hypothyroidism (I do not have these records) and she was also referred to endocrinology.   She does complain of hot flushes.  She denies palpitations, tremors, insomnia (she has nocturia x1, but she is able to fall back asleep), diarrhea, anxiety.  She also has weight loss in the last 6 months, but she mentions that this is through improving diet.  During investigation for thyrotoxicosis, she had a thyroid ultrasound which showed several nodules.  Thyroid nodules:  Thyroid U/S (04/06/2021): 7 nodules of which 2 dominant, which lead biopsies: Parenchymal Echotexture: Moderately heterogeneous Isthmus: 0.7 cm Right lobe: 5.2 x 1.8 x 2.0 cm Left lobe: 5.2 x 1.8 x 2.2 cm  _________________________________________________________   Nodule # 1: Location: Isthmus; superior Maximum size: 1.2 cm; Other 2 dimensions: 1.1 x 0.4 cm Composition: solid/almost completely solid (2) Echogenicity: isoechoic (1)   Given size (<1.4 cm) and appearance, this nodule does NOT meet TI-RADS criteria for biopsy or dedicated follow-up.   _________________________________________________________   Nodule # 2: Location: Right; mid Maximum size: 0.7 cm; Other 2 dimensions: 0.6 x 0.4 cm Composition: solid/almost completely solid (2) Echogenicity: isoechoic (1)  Given size (<1.4 cm) and appearance, this nodule does NOT meet TI-RADS criteria for biopsy or dedicated follow-up.  _________________________________________________________   Nodule # 3: Location: Right; mid Maximum size: 1.1 cm; Other 2 dimensions: 0.6 x 0.5 cm Composition: solid/almost completely solid (2) Echogenicity: isoechoic (1)   Given size (<1.4 cm) and appearance, this nodule does NOT meet TI-RADS criteria for biopsy or dedicated follow-up.  ________________________________________________________   Nodule # 4: Location: Right; Inferior Maximum size: 1.5 cm; Other 2 dimensions: 1.5 x 1.1 cm Composition: solid/almost completely solid (2) Echogenicity: isoechoic (1) Echogenic foci: punctate echogenic foci (3)    **Given size (>/= 1.5 cm) and appearance, fine needle aspiration of this moderately suspicious nodule should be considered based on TI-RADS criteria.  _________________________________________________________   Nodule # 5: Location: Right; inferior Maximum size: 0.8 cm; Other 2 dimensions: 0.7 x 0.4 cm Composition: solid/almost completely solid (2) Echogenicity: hypoechoic (2)    Given size (<0.9 cm) and appearance, this nodule does NOT meet TI-RADS criteria for biopsy or dedicated follow-up.  _________________________________________________________   Nodule # 6: Location: Left; superior Maximum size: 1.3 cm; Other 2 dimensions: 1.2 x 1.1 cm Composition: solid/almost completely solid (2) Echogenicity: isoechoic (1)    Given size (<1.4 cm) and appearance, this nodule does NOT meet TI-RADS criteria for biopsy or dedicated follow-up.  _________________________________________________________   Nodule # 7: Location: Left;  mid Maximum size: 2.3 cm; Other 2 dimensions: 1.8 x 1.1 cm Composition: solid/almost completely solid (2)  Echogenicity: hypoechoic (2) Echogenic foci: punctate echogenic foci (3)   **Given size (>/=  1.0 cm) and appearance, fine needle aspiration of this highly suspicious nodule should be considered based on TI-RADS criteria. _________________________________________________________   IMPRESSION: Multiple bilateral thyroid nodules. Nodules 4 and 7 meet criteria for FNA. Remaining nodules do not meet criteria for FNA or surveillance.   Pt denies: - feeling nodules in neck - hoarseness - dysphagia - choking - SOB with lying down  Thyrotoxicosis:  Currently on liothyronine 5 mcg daily.  I reviewed pt's thyroid tests: 01/30/2021: TSH 1.360, free T4 1.27 (0.87-1.77) 09/06/2020: TSH 0.006 08/11/2020: TSH <0.005, free T4 1.46, total T3 117 (71-180), TPO antibodies 146 (0-34), ATA antibodies <1, TRAb 2.92 (0-1.75) 08/04/2020: TSH <0.005  01/28/2020: TSH 0.492 Lab Results  Component Value Date   TSH 1.440 10/07/2018    + FH of thyroid ds.: sister, maternal aunt, maternal cousin, niece. No FH of thyroid cancer. No h/o radiation tx to head or neck.  No recent contrast studies. No steroid use. No herbal supplements. No Biotin supplements or Hair, Skin and Nails vitamins.  She was on biotin in the past but stopped during investigation for thyrotoxicosis.  Pt also has a history of prediabetes.  On Progesterone 200 mg at night.  She is postmenopausal.  ROS: + See HPI No anxiety or depression.  Past Medical History:  Diagnosis Date   High cholesterol    History of abnormal cervical Pap smear    Hypertension    Past Surgical History:  Procedure Laterality Date   COLPOSCOPY     Social History   Socioeconomic History   Marital status: Single    Spouse name: Not on file   Number of children: Not on file   Years of education: Not on file   Highest education level: Not on file   Occupational History   Not on file  Tobacco Use   Smoking status: Never   Smokeless tobacco: Never  Vaping Use   Vaping Use: Never used  Substance and Sexual Activity   Alcohol use: Yes    Comment: occasionally--maybe a drink/month   Drug use: Never   Sexual activity: Yes    Birth control/protection: None  Other Topics Concern   Not on file  Social History Narrative   Not on file   Social Determinants of Health   Financial Resource Strain: Not on file  Food Insecurity: Not on file  Transportation Needs: Not on file  Physical Activity: Not on file  Stress: Not on file  Social Connections: Not on file  Intimate Partner Violence: Not on file   Current Outpatient Medications on File Prior to Visit  Medication Sig Dispense Refill   amLODipine (NORVASC) 5 MG tablet Take 1 tablet by mouth daily.     ibuprofen (ADVIL) 800 MG tablet Take 1 tablet (800 mg total) by mouth 3 (three) times daily. 21 tablet 0   meclizine (ANTIVERT) 25 MG tablet Take 1 tablet (25 mg total) by mouth 3 (three) times daily as needed for dizziness. 30 tablet 0   naproxen (EC NAPROSYN) 500 MG EC tablet 1 tablet as needed     No current facility-administered medications on file prior to visit.   Allergies  Allergen Reactions   Methimazole Other (See Comments)   Family History  Problem Relation Age of Onset   Hyperlipidemia Mother    Hypertension Mother    Congestive Heart Failure Father    Autoimmune disease Sister    Hypertension Brother    Dementia Maternal Grandmother    Colon cancer Maternal Grandfather  Hypertension Brother    Hyperlipidemia Brother    Breast cancer Neg Hx    BRCA 1/2 Neg Hx     PE: BP 138/88 (BP Location: Right Arm, Patient Position: Sitting, Cuff Size: Normal)   Pulse 66   Ht 5' 5"  (1.651 m)   Wt 270 lb 3.2 oz (122.6 kg)   SpO2 99%   BMI 44.96 kg/m  Wt Readings from Last 3 Encounters:  07/05/21 270 lb 3.2 oz (122.6 kg)  02/09/21 279 lb (126.6 kg)  01/20/21 285  lb (129.3 kg)   Constitutional: overweight, in NAD Eyes: PERRLA, EOMI, no exophthalmos ENT: moist mucous membranes, no thyromegaly, no nodules palpated in lower neck, no cervical lymphadenopathy Cardiovascular: RRR, No MRG Respiratory: CTA B Gastrointestinal: abdomen soft, NT, ND, BS+ Musculoskeletal: no deformities, strength intact in all 4 Skin: moist, warm, no rashes Neurological: no tremor with outstretched hands, DTR normal in all 4  ASSESSMENT: 1. Thyroid nodules  2.  Thyrotoxicosis  3.  Hashimoto's thyroiditis  PLAN: 1. Thyroid nodules - I reviewed the images of her thyroid ultrasound: I pointed out that the 2 dominant nodules are larger, this being a risk factor for cancer.   However, these nodules may be signs of inflammation developed in the setting of Hashimoto's thyroiditis, as her TPO antibodies were recently elevated.  Thyroid nodules were also found to be more likely to form in the setting of Graves' disease. We discussed about features that would confer a high risk for cancer: - Hypoechogenicity (the left nodule is hypoechoic while the right is isoechoic) - microcalcifications (both dominant nodules contains punctate hyperechogenic foci which could be colloid granules, but microcalcifications could not be ruled out) - internal blood flow (the right thyroid dominant nodule also has some internal blood flow)  - taller than wide (none of the nodules) - With irregular margins  (none of the nodules) Pt does not have a thyroid cancer family history or a personal history of RxTx to head/neck. All these would favor benignity.  - the best way that we can tell exactly if it is cancer or not is by doing a thyroid biopsy (FNA). I explained what the test entails. - However, for now, since she does have thyrotoxicosis, I advised her that she may need to have a thyroid uptake and scan to see if any of these nodules are hot, since hot nodules are very rarely cancerous and may not need  to be biopsied - If the nodules are not cancerous, we do not need to intervene unless she develops neck compression symptoms, in that case, we might need to do either lobectomy or thyroidectomy - I we will see her back in 3 to 4 months  2.  Thyrotoxicosis -Patient mentions that she developed thyrotoxicosis at the end of last year.  She was started on methimazole for Graves' disease.  I do have records of an elevated TRAb but she also had elevated TPO antibodies at that time.  She was started on methimazole, which she could not tolerate due to rash.  She stopped methimazole and she reportedly became hypothyroid (do not have these records) and was started on liothyronine.  She was told to start 1 to 2 tablets of 5 mcg daily.  She only started 5 mcg.  She continues on this now. -Latest TSH levels on 01/30/2021 was normal, but these were checked before she started Cytomel.  I do not have more recent labs -At this visit, we will check her TFTs and I  will also check TSI antibodies.  I explained the difference between these and her TRAb antibodies.  3.  Hashimoto thyroiditis -She has elevated TPO and TRAb's in the past. -I explained that Hashimoto's thyroiditis is an autoimmune disease.  Discussed about timeline of developing hypothyroidism and the need for follow-up. -We also discussed that starting treatment with thyroid hormones is indicated in case of Hashimoto's hypothyroidism, but not in the case of euthyroid Hashimoto's thyroiditis.  If treatment is needed, this is usually done with levothyroxine with the possible addition of late liothyronine later, if the outcome is not satisfactory.  Labs need to be through [facility]!  Component     Latest Ref Rng & Units 07/05/2021  TSH     0.450 - 4.500 uIU/mL 1.050  T4,Free(Direct)     0.82 - 1.77 ng/dL 0.92  Triiodothyronine,Free,Serum     2.0 - 4.4 pg/mL 2.6  Thyroid Stim Immunoglobulin     0.00 - 0.55 IU/L 0.23  All tests are normal, including TSI's.  At  this point, I would advise a to try to decrease the dose of Cytomel to only 2.5 mg daily for the next 2 weeks, and then stop it completely and return for labs in 5 more weeks afterwards. I would like to go ahead with a thyroid Uptake and scan for now and see if any of her nodules are hot (overactive).  Philemon Kingdom, MD PhD [facility]

## 2021-07-05 ENCOUNTER — Ambulatory Visit (INDEPENDENT_AMBULATORY_CARE_PROVIDER_SITE_OTHER): Payer: 59 | Admitting: Internal Medicine

## 2021-07-05 ENCOUNTER — Other Ambulatory Visit: Payer: Self-pay

## 2021-07-05 ENCOUNTER — Encounter: Payer: Self-pay | Admitting: Internal Medicine

## 2021-07-05 VITALS — BP 138/88 | HR 66 | Ht 65.0 in | Wt 270.2 lb

## 2021-07-05 DIAGNOSIS — E063 Autoimmune thyroiditis: Secondary | ICD-10-CM | POA: Diagnosis not present

## 2021-07-05 DIAGNOSIS — E0590 Thyrotoxicosis, unspecified without thyrotoxic crisis or storm: Secondary | ICD-10-CM | POA: Diagnosis not present

## 2021-07-05 DIAGNOSIS — E042 Nontoxic multinodular goiter: Secondary | ICD-10-CM | POA: Diagnosis not present

## 2021-07-05 NOTE — Patient Instructions (Addendum)
Please stop at the lab.   Please come back for a follow-up appointment in 3-4 months.   

## 2021-07-06 DIAGNOSIS — E042 Nontoxic multinodular goiter: Secondary | ICD-10-CM | POA: Insufficient documentation

## 2021-07-06 DIAGNOSIS — E063 Autoimmune thyroiditis: Secondary | ICD-10-CM | POA: Insufficient documentation

## 2021-07-06 DIAGNOSIS — E0590 Thyrotoxicosis, unspecified without thyrotoxic crisis or storm: Secondary | ICD-10-CM | POA: Insufficient documentation

## 2021-07-06 LAB — THYROID STIMULATING IMMUNOGLOBULIN: Thyroid Stim Immunoglobulin: 0.23 IU/L (ref 0.00–0.55)

## 2021-07-06 LAB — TSH: TSH: 1.05 u[IU]/mL (ref 0.450–4.500)

## 2021-07-06 LAB — T4, FREE: Free T4: 0.92 ng/dL (ref 0.82–1.77)

## 2021-07-06 LAB — T3, FREE: T3, Free: 2.6 pg/mL (ref 2.0–4.4)

## 2021-08-01 ENCOUNTER — Telehealth: Payer: Self-pay | Admitting: Internal Medicine

## 2021-08-01 ENCOUNTER — Encounter: Payer: Self-pay | Admitting: Internal Medicine

## 2021-08-01 NOTE — Telephone Encounter (Signed)
Per patient, has referral for imaging and Ins denied. Appt cancelled per ins reasons, Please call Ins [facility] [phone]

## 2021-08-03 ENCOUNTER — Encounter (HOSPITAL_COMMUNITY): Payer: 59

## 2021-08-03 ENCOUNTER — Encounter (HOSPITAL_COMMUNITY): Admission: RE | Admit: 2021-08-03 | Payer: 59 | Source: Ambulatory Visit

## 2021-08-04 ENCOUNTER — Encounter (HOSPITAL_COMMUNITY): Payer: 59

## 2021-08-07 NOTE — Telephone Encounter (Signed)
Called patient to inform her per Dr Elvera Lennox, "However, for now, we can try to continue without the scan and then obtain it only if TFTs become thyrotoxic again.  This is definitely also an option." Patient understood.

## 2021-08-07 NOTE — Telephone Encounter (Signed)
Called to inform patient of message from Dr Elvera Lennox, " However, for now, we can try to continue without the scan and then obtain it only if TFTs become thyrotoxic again.  This is definitely also an option."  Patient understood.

## 2021-08-28 ENCOUNTER — Other Ambulatory Visit: Payer: 59

## 2021-08-29 ENCOUNTER — Other Ambulatory Visit (INDEPENDENT_AMBULATORY_CARE_PROVIDER_SITE_OTHER): Payer: 59

## 2021-08-29 ENCOUNTER — Other Ambulatory Visit: Payer: Self-pay

## 2021-08-29 DIAGNOSIS — E042 Nontoxic multinodular goiter: Secondary | ICD-10-CM

## 2021-08-29 DIAGNOSIS — E0590 Thyrotoxicosis, unspecified without thyrotoxic crisis or storm: Secondary | ICD-10-CM

## 2021-08-30 LAB — TSH: TSH: 1.26 u[IU]/mL (ref 0.450–4.500)

## 2021-08-30 LAB — T4, FREE: Free T4: 1.28 ng/dL (ref 0.82–1.77)

## 2021-08-30 LAB — T3, FREE: T3, Free: 2.6 pg/mL (ref 2.0–4.4)

## 2021-09-07 ENCOUNTER — Ambulatory Visit: Payer: No Typology Code available for payment source | Attending: Family

## 2021-09-07 DIAGNOSIS — Z23 Encounter for immunization: Secondary | ICD-10-CM

## 2021-09-07 NOTE — Progress Notes (Signed)
   Covid-19 Vaccination Clinic  Name:  Anna Porter    MRN: [id number] DOB: 1965-12-24  09/07/2021  [name] was observed post Covid-19 immunization for 15 minutes without incident. She was provided with Vaccine Information Sheet and instruction to access the V-Safe system.   [name] was instructed to call 911 with any severe reactions post vaccine: Difficulty breathing  Swelling of face and throat  A fast heartbeat  A bad rash all over body  Dizziness and weakness   Immunizations Administered     Name Date Dose VIS Date Route   Moderna Covid-19 vaccine Bivalent Booster 09/07/2021  2:59 PM 0.5 mL 06/10/2021 Intramuscular   Manufacturer: Moderna   Lot: [id number]   NDC: 57903-833-38

## 2021-11-06 ENCOUNTER — Encounter: Payer: Self-pay | Admitting: Internal Medicine

## 2021-11-06 ENCOUNTER — Ambulatory Visit: Payer: 59 | Admitting: Internal Medicine

## 2021-11-06 ENCOUNTER — Other Ambulatory Visit: Payer: Self-pay

## 2021-11-06 VITALS — BP 128/88 | HR 75 | Ht 65.0 in | Wt 278.8 lb

## 2021-11-06 DIAGNOSIS — E063 Autoimmune thyroiditis: Secondary | ICD-10-CM

## 2021-11-06 DIAGNOSIS — E042 Nontoxic multinodular goiter: Secondary | ICD-10-CM

## 2021-11-06 DIAGNOSIS — E0590 Thyrotoxicosis, unspecified without thyrotoxic crisis or storm: Secondary | ICD-10-CM

## 2021-11-06 NOTE — Patient Instructions (Addendum)
Please stop at the lab.  Please send me a message before next visit to order a thyroid U/S.  Please come back for a follow-up appointment in 6 months.

## 2021-11-06 NOTE — Progress Notes (Signed)
Patient ID: Anna Porter, female   DOB: 1966/05/18, 56 y.o.   MRN: [id number]  This visit occurred during the SARS-CoV-2 public health emergency.  Safety protocols were in place, including screening questions prior to the visit, additional usage of staff PPE, and extensive cleaning of exam room while observing appropriate contact time as indicated for disinfecting solutions.   HPI  Anna Porter is a 56 y.o.-year-old female, initially referred by her PCP, Dr. Janie Morning, returning for follow-up for thyroid nodules, Hashimoto's thyroiditis, and thyrotoxicosis.  Last visit 4 months ago.  Interim history: She has hot flushes, but not tremors, palpitations, weight loss.  Reviewed history: She was found to have a suppressed TSH in 07/2020 >> referred to endocrinology (Dr. Garnet Koyanagi) >> dx'ed Graves ds. >> started Methimazole 08/2020 >> developed rash >> stopped 09/2020.   In 12/2020 started to go to [facility] >> in 03/30/2021 started Liothyronine 5 mcg daily for hypothyroidism (I do not have these records) and she was also referred to endocrinology.   She does complain of hot flushes.  She denies palpitations, tremors, insomnia (she has nocturia x1, but she is able to fall back asleep), diarrhea, anxiety.  She also has weight loss in the last 6 months, but she mentions that this is through improving diet.  During investigation for thyrotoxicosis, she had a thyroid ultrasound which showed several nodules.  Thyroid nodules:  Thyroid U/S (04/06/2021): 7 nodules of which 2 dominant, which lead biopsies: Parenchymal Echotexture: Moderately heterogeneous Isthmus: 0.7 cm Right lobe: 5.2 x 1.8 x 2.0 cm Left lobe: 5.2 x 1.8 x 2.2 cm  _________________________________________________________   Nodule # 1: Location: Isthmus; superior Maximum size: 1.2 cm; Other 2 dimensions: 1.1 x 0.4 cm Composition: solid/almost completely solid (2) Echogenicity: isoechoic (1)   Given  size (<1.4 cm) and appearance, this nodule does NOT meet TI-RADS criteria for biopsy or dedicated follow-up.  _________________________________________________________   Nodule # 2: Location: Right; mid Maximum size: 0.7 cm; Other 2 dimensions: 0.6 x 0.4 cm Composition: solid/almost completely solid (2) Echogenicity: isoechoic (1)  Given size (<1.4 cm) and appearance, this nodule does NOT meet TI-RADS criteria for biopsy or dedicated follow-up.  _________________________________________________________   Nodule # 3: Location: Right; mid Maximum size: 1.1 cm; Other 2 dimensions: 0.6 x 0.5 cm Composition: solid/almost completely solid (2) Echogenicity: isoechoic (1)   Given size (<1.4 cm) and appearance, this nodule does NOT meet TI-RADS criteria for biopsy or dedicated follow-up.  ________________________________________________________   Nodule # 4: Location: Right; Inferior Maximum size: 1.5 cm; Other 2 dimensions: 1.5 x 1.1 cm Composition: solid/almost completely solid (2) Echogenicity: isoechoic (1) Echogenic foci: punctate echogenic foci (3)    **Given size (>/= 1.5 cm) and appearance, fine needle aspiration of this moderately suspicious nodule should be considered based on TI-RADS criteria.  _________________________________________________________   Nodule # 5: Location: Right; inferior Maximum size: 0.8 cm; Other 2 dimensions: 0.7 x 0.4 cm Composition: solid/almost completely solid (2) Echogenicity: hypoechoic (2)    Given size (<0.9 cm) and appearance, this nodule does NOT meet TI-RADS criteria for biopsy or dedicated follow-up.  _________________________________________________________   Nodule # 6: Location: Left; superior Maximum size: 1.3 cm; Other 2 dimensions: 1.2 x 1.1 cm Composition: solid/almost completely solid (2) Echogenicity: isoechoic (1)    Given size (<1.4 cm) and appearance, this nodule does NOT meet TI-RADS criteria for biopsy or  dedicated follow-up.  _________________________________________________________   Nodule # 7: Location: Left; mid Maximum size: 2.3 cm; Other 2 dimensions:  1.8 x 1.1 cm Composition: solid/almost completely solid (2)  Echogenicity: hypoechoic (2) Echogenic foci: punctate echogenic foci (3)   **Given size (>/= 1.0 cm) and appearance, fine needle aspiration of this highly suspicious nodule should be considered based on TI-RADS criteria. _________________________________________________________   IMPRESSION: Multiple bilateral thyroid nodules. Nodules 4 and 7 meet criteria for FNA. Remaining nodules do not meet criteria for FNA or surveillance.   Pt denies: - feeling nodules in neck - hoarseness - dysphagia - choking - SOB with lying down  Thyrotoxicosis:  At last visit she was on liothyronine 5 mcg daily.  We tapered this to off with normal TFTs afterwards.  I reviewed pt's thyroid tests: Lab Results  Component Value Date   TSH 1.260 08/29/2021   TSH 1.050 07/05/2021   TSH 1.440 10/07/2018   FREET4 1.28 08/29/2021   FREET4 0.92 07/05/2021  02/17/2021: TSH 0.707, free T4 1.27 (0.82-1.77), reverse T3 21.5 (9.2-24.1), Thyroglobulin antibodies <1, TPO antibodies 101 (high), selenium 142 (93-198) 01/30/2021: TSH 1.360, free T4 1.27 (0.87-1.77) 09/06/2020: TSH 0.006 08/11/2020: TSH <0.005, free T4 1.46, total T3 117 (71-180), TPO antibodies 146 (0-34), ATA antibodies <1, TRAb 2.92 (0-1.75) 08/04/2020: TSH <0.005  01/28/2020: TSH 0.492  + FH of thyroid ds.: sister, maternal aunt, maternal cousin, niece. No FH of thyroid cancer. No h/o radiation tx to head or neck.  No recent contrast studies. No steroid use. No herbal supplements. No Biotin supplements or Hair, Skin and Nails vitamins.  She was on biotin in the past but stopped during investigation for thyrotoxicosis.  Pt also has a history of prediabetes.  On Progesterone 200 mg at night.  She is postmenopausal.  Labs from  [facility] received (02/17/2021) and scanned: TSH 0.707, free T4 1.27 (0.82-1.77), reverse T3 21.5 (9.2-24.1) Higher norepinephrine 1120 (0-874), otherwise normal catecholamines Leptin 82 (apparently normal for BMI) Ferritin 82 Serotonin 90 (0-420) Lipids: 248/41/71/171 Also had an amino acids profile checked BUN/creatinine 12/1.04, GFR 63, glucose 98, alkaline phosphatase 124 (44-121) CBC normal B12 1296, folate more than 20 LH/FSH 54.2/65.4 HbA1c 6.3%, previously 6.3% (01/30/2021) Cortisol 7.2 Estradiol less than 5, DHEA 95 Vitamin D28.9 Free testosterone 1.7 Homocystine 7.6 (0-14.5) ESR 29 Thyroglobulin antibodies <1, TPO antibodies 101 (high), selenium 142 (93-198) Insulin 14.5 Growth hormone 0.1 (0-10)  ROS: + See HPI No anxiety or depression.  Past Medical History:  Diagnosis Date   High cholesterol    History of abnormal cervical Pap smear    Hypertension    Past Surgical History:  Procedure Laterality Date   COLPOSCOPY     Social History   Socioeconomic History   Marital status: Single    Spouse name: Not on file   Number of children: 0   Years of education: Not on file   Highest education level: Not on file  Occupational History   Occupation: Scientist, forensic  Tobacco Use   Smoking status: Never   Smokeless tobacco: Never  Vaping Use   Vaping Use: Never used  Substance and Sexual Activity   Alcohol use: Yes    Comment: occasionally--maybe a drink/month - wine   Drug use: Never   Sexual activity: Yes    Birth control/protection: None  Other Topics Concern   Not on file  Social History Narrative   Not on file   Social Determinants of Health   Financial Resource Strain: Not on file  Food Insecurity: Not on file  Transportation Needs: Not on file  Physical Activity: Not on file  Stress:  Not on file  Social Connections: Not on file  Intimate Partner Violence: Not on file   Current Outpatient Medications on File Prior to Visit   Medication Sig Dispense Refill   amLODipine (NORVASC) 5 MG tablet Take 1 tablet by mouth daily.     ibuprofen (ADVIL) 800 MG tablet Take 1 tablet (800 mg total) by mouth 3 (three) times daily. 21 tablet 0   meclizine (ANTIVERT) 25 MG tablet Take 1 tablet (25 mg total) by mouth 3 (three) times daily as needed for dizziness. 30 tablet 0   naproxen (EC NAPROSYN) 500 MG EC tablet 1 tablet as needed     progesterone (PROMETRIUM) 100 MG capsule Take by mouth.     No current facility-administered medications on file prior to visit.   Allergies  Allergen Reactions   Methimazole Other (See Comments)   Family History  Problem Relation Age of Onset   Hyperlipidemia Mother    Hypertension Mother    Congestive Heart Failure Father    Autoimmune disease Sister    Hypertension Brother    Dementia Maternal Grandmother    Colon cancer Maternal Grandfather    Hypertension Brother    Hyperlipidemia Brother    Breast cancer Neg Hx    BRCA 1/2 Neg Hx     PE: BP 128/88 (BP Location: Right Arm, Patient Position: Sitting, Cuff Size: Normal)    Pulse 75    Ht _0  (1.651 m)    Wt 278 lb 12.8 oz (126.5 kg)    SpO2 99%    BMI 46.39 kg/m  Wt Readings from Last 3 Encounters:  11/06/21 278 lb 12.8 oz (126.5 kg)  07/05/21 270 lb 3.2 oz (122.6 kg)  02/09/21 279 lb (126.6 kg)   Constitutional: overweight, in NAD Eyes: PERRLA, EOMI, no exophthalmos ENT: moist mucous membranes, no thyromegaly, no nodules palpated in lower neck, no cervical lymphadenopathy Cardiovascular: RRR, No MRG Respiratory: CTA B Musculoskeletal: no deformities, strength intact in all 4 Skin: moist, warm, no rashes Neurological: no tremor with outstretched hands, DTR normal in all 4  ASSESSMENT: 1. Thyroid nodules  2.  Thyrotoxicosis  3.  Hashimoto's thyroiditis  PLAN: 1. Thyroid nodules -I reviewed the images and report of her thyroid ultrasound: I pointed out that the 2 dominant nodules are larger, this being a risk  factor for cancer.  However, these nodules may be just signs of inflammation (or nodules) developed in the setting of Hashimoto's thyroiditis, as her TPO antibodies were found to be elevated.  Thyroid nodules were also found to be more likely in the setting of Graves' disease. We discussed about ultrasound features that would come for a higher risk for cancer: - Hypoechogenicity (the left nodule is hypoechoic while the right is isoechoic) - microcalcifications (both dominant nodules contains punctate hyperechogenic foci which could be colloid granules, but microcalcifications could not be ruled out) - internal blood flow (the right thyroid dominant nodule also has some internal blood flow)  - taller than wide (none of the nodules) - With irregular margins  (none of the nodules) She does not have a thyroid cancer family history or personal history of radiation therapy to head or neck, all of these favoring benignity.   -At last visit, since she has thyrotoxicosis with a history of thyroid nodules, I suggested a thyroid uptake and scan, but this was not covered by her insurance. -We will repeat a thyroid ultrasound a year from the previous.  I advised her to get in touch  with me towards the end of June of this year so I can order it. - I we will see her back in 6 months  2.  Thyrotoxicosis -Patient developed thyrotoxicosis at the end of 2021.  She was started on methimazole for Graves' disease but she could not tolerate it due to rash.  She stopped methimazole and reportedly became hypothyroid (I do not have these records) and was started on liothyronine.  She was advised to start 1-2 tablets of 5 mcg daily.  She only started 5 mcg daily.  At last visit, TFTs were normal so I advised her to taper this off. -Repeat TFTs were again normal after stopping liothyronine -including at last check in 08/2021 -At this visit, we will recheck her TFTs  3.  Hashimoto thyroiditis -She had elevated TPO and TRAb's  in the past -Discussed about the possibility of developing hypothyroidism in the future due to the history of Hashimoto's thyroiditis -We also discussed the treatment is only indicated in case she develops hypothyroidism, but not in the case of euthyroidism.  Labs need to be through [facility]!  Component     Latest Ref Rng & Units 11/06/2021  TSH     0.450 - 4.500 uIU/mL 1.230  T4,Free(Direct)     0.82 - 1.77 ng/dL 1.18  Triiodothyronine,Free,Serum     2.0 - 4.4 pg/mL 2.2  TFTs are all excellent!  Philemon Kingdom, MD PhD [facility]

## 2021-11-07 LAB — T3, FREE: T3, Free: 2.2 pg/mL (ref 2.0–4.4)

## 2021-11-07 LAB — T4, FREE: Free T4: 1.18 ng/dL (ref 0.82–1.77)

## 2021-11-07 LAB — TSH: TSH: 1.23 u[IU]/mL (ref 0.450–4.500)

## 2022-02-12 NOTE — Progress Notes (Signed)
56 y.o. G0P0000 Single African American female here for annual exam.   ? ?No vaginal bleeding for over one year.  ? ?Patient is being seen at a wellness center in [place].  ?Does have hot flashes.  ?She is taking Prometrium 100 mg q hs. ?FSH 62.3 and estradiol 14.3 on 02/17/21.  ? ?She is also having follow up of her thyroid which was overactive for a while. ? ?She is working on weight loss.  ? ?Declines STD screening.  ?Not sexually active.  ? ?PCP:  Dana Collins, DO  ? ?Patient's last menstrual period was 10/29/2020 (approximate).     ?  ?    ?Sexually active: No.  ?The current method of family planning is post menopausal status.    ?Exercising: Yes.     walking ?Smoker:  no ? ?Health Maintenance: ?Pap:  10-16-19 Neg:Neg HR HPV, 10-07-18 Neg:Neg HR HPV, 09/17/17 Normal  ?History of abnormal Pap:  Yes, 2017 had colposcopy which was normal,  paps normal since. No treatment to cervix ?MMG:  03-30-21 Neg/BiRads1 ?Colonoscopy:  2017 polyps removed;10 years ?BMD:   n/a  Result  n/a ?TDaP:  10-07-18  ?Gardasil:   n/a ?HIV: 10-16-19 NR ?Hep C: 10-16-19 Neg ?Screening Labs:  PCP ? ? reports that she has never smoked. She has never used smokeless tobacco. She reports current alcohol use. She reports that she does not use drugs. ? ?Past Medical History:  ?Diagnosis Date  ? High cholesterol   ? History of abnormal cervical Pap smear   ? Hypertension   ? ? ?Past Surgical History:  ?Procedure Laterality Date  ? COLPOSCOPY    ? ? ?Current Outpatient Medications  ?Medication Sig Dispense Refill  ? amLODipine (NORVASC) 5 MG tablet 1 tablet    ? naproxen (EC NAPROSYN) 500 MG EC tablet 1 tablet as needed    ? progesterone (PROMETRIUM) 100 MG capsule Take by mouth.    ? ?No current facility-administered medications for this visit.  ? ? ?Family History  ?Problem Relation Age of Onset  ? Hyperlipidemia Mother   ? Hypertension Mother   ? Congestive Heart Failure Father   ? Autoimmune disease Sister   ? Hypertension Brother   ? Dementia  Maternal Grandmother   ? Colon cancer Maternal Grandfather   ? Hypertension Brother   ? Hyperlipidemia Brother   ? Breast cancer Neg Hx   ? BRCA 1/2 Neg Hx   ? ? ?Review of Systems  ?All other systems reviewed and are negative. ? ?Exam:   ?BP 120/80   Pulse 81   Ht 5' 4.75" (1.645 m)   Wt 274 lb (124.3 kg)   LMP 10/29/2020 (Approximate)   SpO2 99%   BMI 45.95 kg/m?     ?General appearance: alert, cooperative and appears stated age ?Head: normocephalic, without obvious abnormality, atraumatic ?Neck: no adenopathy, supple, symmetrical, trachea midline and thyroid normal to inspection and palpation ?Lungs: clear to auscultation bilaterally ?Breasts: normal appearance, no masses or tenderness, No nipple retraction or dimpling, No nipple discharge or bleeding, No axillary adenopathy ?Heart: regular rate and rhythm ?Abdomen: soft, non-tender; no masses, no organomegaly ?Extremities: extremities normal, atraumatic, no cyanosis or edema ?Skin: skin color, texture, turgor normal. No rashes or lesions ?Lymph nodes: cervical, supraclavicular, and axillary nodes normal. ?Neurologic: grossly normal ? ?Pelvic: External genitalia:  no lesions ?             No abnormal inguinal nodes palpated. ?               Urethra:  normal appearing urethra with no masses, tenderness or lesions ?             Bartholins and Skenes: normal    ?             Vagina: normal appearing vagina with normal color and discharge, no lesions ?             Cervix: no lesions ?             Pap taken: no ?Bimanual Exam:  Uterus:  normal size, contour, position, consistency, mobility, non-tender ?             Adnexa: no mass, fullness, tenderness ?             Rectal exam: yes.  Confirms. ?             Anus:  normal sphincter tone, no lesions ? ?Chaperone was present for exam:  Amanda, CMA ? ?Assessment:   ?Well woman visit with gynecologic exam. ?Menopausal symptoms.  ?On Prometrium through wellness center.  ?Remote history of abnormal pap.   ? ?Plan: ?Mammogram screening discussed. ?Self breast awareness reviewed. ?Pap and HR HPV 2025 ?Guidelines for Calcium, Vitamin D, regular exercise program including cardiovascular and weight bearing exercise. ?She understands to call for any future vaginal bleeding.  She understands postmenopausal bleeding can be a sign of many different things, including cancer.  ?Follow up annually and prn.  ? ?After visit summary provided.  ? ? ? ?

## 2022-02-14 ENCOUNTER — Ambulatory Visit (INDEPENDENT_AMBULATORY_CARE_PROVIDER_SITE_OTHER): Payer: 59 | Admitting: Obstetrics and Gynecology

## 2022-02-14 ENCOUNTER — Encounter: Payer: Self-pay | Admitting: Obstetrics and Gynecology

## 2022-02-14 VITALS — BP 120/80 | HR 81 | Ht 64.75 in | Wt 274.0 lb

## 2022-02-14 DIAGNOSIS — Z01419 Encounter for gynecological examination (general) (routine) without abnormal findings: Secondary | ICD-10-CM | POA: Diagnosis not present

## 2022-02-14 NOTE — Patient Instructions (Signed)
EXERCISE AND DIET:  We recommended that you start or continue a regular exercise program for good health. Regular exercise means any activity that makes your heart beat faster and makes you sweat.  We recommend exercising at least 30 minutes per day at least 3 days a week, preferably 4 or 5.  We also recommend a diet low in fat and sugar.  Inactivity, poor dietary choices and obesity can cause diabetes, heart attack, stroke, and kidney damage, among others.   ? ?ALCOHOL AND SMOKING:  Women should limit their alcohol intake to no more than 7 drinks/beers/glasses of wine (combined, not each!) per week. Moderation of alcohol intake to this level decreases your risk of breast cancer and liver damage. And of course, no recreational drugs are part of a healthy lifestyle.  And absolutely no smoking or even second hand smoke. Most people know smoking can cause heart and lung diseases, but did you know it also contributes to weakening of your bones? Aging of your skin?  Yellowing of your teeth and nails? ? ?CALCIUM AND VITAMIN D:  Adequate intake of calcium and Vitamin D are recommended.  The recommendations for exact amounts of these supplements seem to change often, but generally speaking 600 mg of calcium (either carbonate or citrate) and 800 units of Vitamin D per day seems prudent. Certain women may benefit from higher intake of Vitamin D.  If you are among these women, your doctor will have told you during your visit.   ? ?PAP SMEARS:  Pap smears, to check for cervical cancer or precancers,  have traditionally been done yearly, although recent scientific advances have shown that most women can have pap smears less often.  However, every woman still should have a physical exam from her gynecologist every year. It will include a breast check, inspection of the vulva and vagina to check for abnormal growths or skin changes, a visual exam of the cervix, and then an exam to evaluate the size and shape of the uterus and  ovaries.  And after 57 years of age, a rectal exam is indicated to check for rectal cancers. We will also provide age appropriate advice regarding health maintenance, like when you should have certain vaccines, screening for sexually transmitted diseases, bone density testing, colonoscopy, mammograms, etc.  ? ?MAMMOGRAMS:  All women over 40 years old should have a yearly mammogram. Many facilities now offer a "3D" mammogram, which may cost around $50 extra out of pocket. If possible,  we recommend you accept the option to have the 3D mammogram performed.  It both reduces the number of women who will be called back for extra views which then turn out to be normal, and it is better than the routine mammogram at detecting truly abnormal areas.   ? ?COLONOSCOPY:  Colonoscopy to screen for colon cancer is recommended for all women at age 50.  We know, you hate the idea of the prep.  We agree, BUT, having colon cancer and not knowing it is worse!!  Colon cancer so often starts as a polyp that can be seen and removed at colonscopy, which can quite literally save your life!  And if your first colonoscopy is normal and you have no family history of colon cancer, most women don't have to have it again for 10 years.  Once every ten years, you can do something that may end up saving your life, right?  We will be happy to help you get it scheduled when you are ready.    Be sure to check your insurance coverage so you understand how much it will cost.  It may be covered as a preventative service at no cost, but you should check your particular policy.   ? ?Calcium Content in Foods ?Calcium is the most abundant mineral in the body. Most of the body's calcium supply is stored in bones and teeth. Calcium helps many parts of the body function normally, including: ?Blood and blood vessels. ?Nerves. ?Hormones. ?Muscles. ?Bones and teeth. ?When your calcium stores are low, you may be at risk for low bone mass, bone loss, and broken bones  (fractures). When you get enough calcium, it helps to support strong bones and teeth throughout your life. ?Calcium is especially important for: ?Children during growth spurts. ?Girls during adolescence. ?Women who are pregnant or breastfeeding. ?Women after their menstrual cycle stops (postmenopause). ?Women whose menstrual cycle has stopped due to anorexia nervosa or regular intense exercise. ?People who cannot eat or digest dairy products. ?Vegans. ?Recommended daily amounts of calcium: ?Women (ages 19 to 50): 1,000 mg per day. ?Women (ages 51 and older): 1,200 mg per day. ?Men (ages 19 to 70): 1,000 mg per day. ?Men (ages 71 and older): 1,200 mg per day. ?Women (ages 9 to 18): 1,300 mg per day. ?Men (ages 9 to 18): 1,300 mg per day. ?General information ?Eat foods that are high in calcium. Try to get most of your calcium from food. ?Some people may benefit from taking calcium supplements. Check with your health care provider or diet and nutrition specialist (dietitian) before starting any calcium supplements. Calcium supplements may interact with certain medicines. Too much calcium may cause other health problems, such as constipation and kidney stones. ?For the body to absorb calcium, it needs vitamin D. Sources of vitamin D include: ?Skin exposure to direct sunlight. ?Foods, such as egg yolks, liver, mushrooms, saltwater fish, and fortified milk. ?Vitamin D supplements. Check with your health care provider or dietitian before starting any vitamin D supplements. ?What foods are high in calcium? ? ?Foods that are high in calcium contain more than 100 milligrams per serving. ?Fruits ?Fortified orange juice or other fruit juice, 300 mg per 8 oz serving. ?Vegetables ?Collard greens, 360 mg per 8 oz serving. ?Kale, 100 mg per 8 oz serving. ?Bok choy, 160 mg per 8 oz serving. ?Grains ?Fortified ready-to-eat cereals, 100 to 1,000 mg per 8 oz serving. ?Fortified frozen waffles, 200 mg in 2 waffles. ?Oatmeal, 140 mg in  1 cup. ?Meats and other proteins ?Sardines, canned with bones, 325 mg per 3 oz serving. ?Salmon, canned with bones, 180 mg per 3 oz serving. ?Canned shrimp, 125 mg per 3 oz serving. ?Baked beans, 160 mg per 4 oz serving. ?Tofu, firm, made with calcium sulfate, 253 mg per 4 oz serving. ?Dairy ?Yogurt, plain, low-fat, 310 mg per 6 oz serving. ?Nonfat milk, 300 mg per 8 oz serving. ?American cheese, 195 mg per 1 oz serving. ?Cheddar cheese, 205 mg per 1 oz serving. ?Cottage cheese 2%, 105 mg per 4 oz serving. ?Fortified soy, rice, or almond milk, 300 mg per 8 oz serving. ?Mozzarella, part skim, 210 mg per 1 oz serving. ?The items listed above may not be a complete list of foods high in calcium. Actual amounts of calcium may be different depending on processing. Contact a dietitian for more information. ?What foods are lower in calcium? ?Foods that are lower in calcium contain 50 mg or less per serving. ?Fruits ?Apple, about 6 mg. ?Banana, about 12 mg. ?  Vegetables ?Lettuce, 19 mg per 2 oz serving. ?Tomato, about 11 mg. ?Grains ?Rice, 4 mg per 6 oz serving. ?Boiled potatoes, 14 mg per 8 oz serving. ?White bread, 6 mg per slice. ?Meats and other proteins ?Egg, 27 mg per 2 oz serving. ?Red meat, 7 mg per 4 oz serving. ?Chicken, 17 mg per 4 oz serving. ?Fish, cod, or trout, 20 mg per 4 oz serving. ?Dairy ?Cream cheese, regular, 14 mg per 1 Tbsp serving. ?Brie cheese, 50 mg per 1 oz serving. ?Parmesan cheese, 70 mg per 1 Tbsp serving. ?The items listed above may not be a complete list of foods lower in calcium. Actual amounts of calcium may be different depending on processing. Contact a dietitian for more information. ?Summary ?Calcium is an important mineral in the body because it affects many functions. Getting enough calcium helps support strong bones and teeth throughout your life. ?Try to get most of your calcium from food. ?Calcium supplements may interact with certain medicines. Check with your health care provider  or dietitian before starting any calcium supplements. ?This information is not intended to replace advice given to you by your health care provider. Make sure you discuss any questions you have with your h

## 2022-02-26 ENCOUNTER — Other Ambulatory Visit: Payer: Self-pay | Admitting: Obstetrics and Gynecology

## 2022-02-26 DIAGNOSIS — Z1231 Encounter for screening mammogram for malignant neoplasm of breast: Secondary | ICD-10-CM

## 2022-02-26 HISTORY — PX: BREAST BIOPSY: SHX20

## 2022-04-02 ENCOUNTER — Ambulatory Visit
Admission: RE | Admit: 2022-04-02 | Discharge: 2022-04-02 | Disposition: A | Discharge status: Home / Self Care | Payer: 59 | Source: Ambulatory Visit | Attending: Obstetrics and Gynecology | Admitting: Obstetrics and Gynecology

## 2022-04-02 DIAGNOSIS — Z1231 Encounter for screening mammogram for malignant neoplasm of breast: Secondary | ICD-10-CM

## 2022-04-04 ENCOUNTER — Other Ambulatory Visit: Payer: Self-pay | Admitting: Obstetrics and Gynecology

## 2022-04-04 DIAGNOSIS — R928 Other abnormal and inconclusive findings on diagnostic imaging of breast: Secondary | ICD-10-CM

## 2022-04-11 ENCOUNTER — Ambulatory Visit
Admission: RE | Admit: 2022-04-11 | Discharge: 2022-04-11 | Disposition: A | Discharge status: Home / Self Care | Payer: 59 | Source: Ambulatory Visit | Attending: Obstetrics and Gynecology | Admitting: Obstetrics and Gynecology

## 2022-04-11 ENCOUNTER — Other Ambulatory Visit: Payer: Self-pay | Admitting: Obstetrics and Gynecology

## 2022-04-11 DIAGNOSIS — N6310 Unspecified lump in the right breast, unspecified quadrant: Secondary | ICD-10-CM

## 2022-04-11 DIAGNOSIS — R928 Other abnormal and inconclusive findings on diagnostic imaging of breast: Secondary | ICD-10-CM

## 2022-04-12 ENCOUNTER — Other Ambulatory Visit: Payer: 59

## 2022-04-13 ENCOUNTER — Other Ambulatory Visit: Payer: Self-pay | Admitting: Obstetrics and Gynecology

## 2022-04-13 ENCOUNTER — Ambulatory Visit
Admission: RE | Admit: 2022-04-13 | Discharge: 2022-04-13 | Disposition: A | Discharge status: Home / Self Care | Payer: 59 | Source: Ambulatory Visit | Attending: Obstetrics and Gynecology | Admitting: Obstetrics and Gynecology

## 2022-04-13 DIAGNOSIS — N6310 Unspecified lump in the right breast, unspecified quadrant: Secondary | ICD-10-CM

## 2022-04-18 ENCOUNTER — Ambulatory Visit
Admission: RE | Admit: 2022-04-18 | Discharge: 2022-04-18 | Disposition: A | Discharge status: Home / Self Care | Payer: 59 | Source: Ambulatory Visit | Attending: Obstetrics and Gynecology | Admitting: Obstetrics and Gynecology

## 2022-04-18 DIAGNOSIS — N6310 Unspecified lump in the right breast, unspecified quadrant: Secondary | ICD-10-CM

## 2022-05-10 ENCOUNTER — Encounter: Payer: Self-pay | Admitting: Internal Medicine

## 2022-05-10 ENCOUNTER — Ambulatory Visit: Payer: 59 | Admitting: Internal Medicine

## 2022-05-10 VITALS — BP 126/82 | HR 87 | Ht 64.75 in | Wt 258.2 lb

## 2022-05-10 DIAGNOSIS — E042 Nontoxic multinodular goiter: Secondary | ICD-10-CM

## 2022-05-10 DIAGNOSIS — E0590 Thyrotoxicosis, unspecified without thyrotoxic crisis or storm: Secondary | ICD-10-CM

## 2022-05-10 DIAGNOSIS — E063 Autoimmune thyroiditis: Secondary | ICD-10-CM | POA: Diagnosis not present

## 2022-05-10 NOTE — Patient Instructions (Addendum)
Please stop at the lab.  I will order a thyroid U/S.  Please come back for a follow-up appointment in 1 year.

## 2022-05-10 NOTE — Progress Notes (Addendum)
Patient ID: Anna Porter, female   DOB: 12-20-1965, 56 y.o.   MRN: [id number]  HPI  Anna Porter is a 56 y.o.-year-old female, initially referred by her PCP, Dr. Janie Morning, returning for follow-up for thyroid nodules, Hashimoto's thyroiditis, and thyrotoxicosis.  Last visit 6 months ago.  Interim history: No hot flushes, tremors, palpitations.  After last visit with Dr. Theda Sers, she lost 16 lbs >> in last 3 mo: celery juice in am, cut down eating out, lean proteins, no fried foods.  She tells me her brother was also recently diagnosed with thyrotoxicosis.  Reviewed history: She was found to have a suppressed TSH in 07/2020 >> referred to endocrinology (Dr. Garnet Koyanagi) >> dx'ed Graves ds. >> started Methimazole 08/2020 >> developed rash >> stopped 09/2020.   In 12/2020 started to go to [facility] >> in 03/30/2021 started Liothyronine 5 mcg daily for hypothyroidism (I do not have these records) and she was also referred to endocrinology.   She does complain of hot flushes.  She denies palpitations, tremors, insomnia (she has nocturia x1, but she is able to fall back asleep), diarrhea, anxiety.  She also has weight loss in the last 6 months, but she mentions that this is through improving diet.  During investigation for thyrotoxicosis, she had a thyroid ultrasound which showed several nodules.  Thyroid nodules:  Thyroid U/S (04/06/2021): 7 nodules of which 2 dominant, which lead biopsies: Parenchymal Echotexture: Moderately heterogeneous Isthmus: 0.7 cm Right lobe: 5.2 x 1.8 x 2.0 cm Left lobe: 5.2 x 1.8 x 2.2 cm  _________________________________________________________   Nodule # 1: Location: Isthmus; superior Maximum size: 1.2 cm; Other 2 dimensions: 1.1 x 0.4 cm Composition: solid/almost completely solid (2) Echogenicity: isoechoic (1)   Given size (<1.4 cm) and appearance, this nodule does NOT meet TI-RADS criteria for biopsy or dedicated follow-up.   _________________________________________________________   Nodule # 2: Location: Right; mid Maximum size: 0.7 cm; Other 2 dimensions: 0.6 x 0.4 cm Composition: solid/almost completely solid (2) Echogenicity: isoechoic (1)  Given size (<1.4 cm) and appearance, this nodule does NOT meet TI-RADS criteria for biopsy or dedicated follow-up.  _________________________________________________________   Nodule # 3: Location: Right; mid Maximum size: 1.1 cm; Other 2 dimensions: 0.6 x 0.5 cm Composition: solid/almost completely solid (2) Echogenicity: isoechoic (1)   Given size (<1.4 cm) and appearance, this nodule does NOT meet TI-RADS criteria for biopsy or dedicated follow-up.  ________________________________________________________   Nodule # 4: Location: Right; Inferior Maximum size: 1.5 cm; Other 2 dimensions: 1.5 x 1.1 cm Composition: solid/almost completely solid (2) Echogenicity: isoechoic (1) Echogenic foci: punctate echogenic foci (3)    **Given size (>/= 1.5 cm) and appearance, fine needle aspiration of this moderately suspicious nodule should be considered based on TI-RADS criteria.  _________________________________________________________   Nodule # 5: Location: Right; inferior Maximum size: 0.8 cm; Other 2 dimensions: 0.7 x 0.4 cm Composition: solid/almost completely solid (2) Echogenicity: hypoechoic (2)    Given size (<0.9 cm) and appearance, this nodule does NOT meet TI-RADS criteria for biopsy or dedicated follow-up.  _________________________________________________________   Nodule # 6: Location: Left; superior Maximum size: 1.3 cm; Other 2 dimensions: 1.2 x 1.1 cm Composition: solid/almost completely solid (2) Echogenicity: isoechoic (1)    Given size (<1.4 cm) and appearance, this nodule does NOT meet TI-RADS criteria for biopsy or dedicated follow-up.  _________________________________________________________   Nodule # 7: Location: Left;  mid Maximum size: 2.3 cm; Other 2 dimensions: 1.8 x 1.1 cm Composition: solid/almost completely solid (2)  Echogenicity: hypoechoic (2) Echogenic foci: punctate echogenic foci (3)   **Given size (>/= 1.0 cm) and appearance, fine needle aspiration of this highly suspicious nodule should be considered based on TI-RADS criteria. _________________________________________________________   IMPRESSION: Multiple bilateral thyroid nodules. Nodules 4 and 7 meet criteria for FNA. Remaining nodules do not meet criteria for FNA or surveillance.   Pt denies: - feeling nodules in neck - hoarseness - dysphagia - choking  Thyrotoxicosis:  At last visit she was on liothyronine 5 mcg daily.  We tapered this to off with normal TFTs afterwards.  I reviewed pt's thyroid tests: Lab Results  Component Value Date   TSH 1.230 11/06/2021   TSH 1.260 08/29/2021   TSH 1.050 07/05/2021   TSH 1.440 10/07/2018   FREET4 1.18 11/06/2021   FREET4 1.28 08/29/2021   FREET4 0.92 07/05/2021  02/17/2021: TSH 0.707, free T4 1.27 (0.82-1.77), reverse T3 21.5 (9.2-24.1), Thyroglobulin antibodies <1, TPO antibodies 101 (high), selenium 142 (93-198) 01/30/2021: TSH 1.360, free T4 1.27 (0.87-1.77) 09/06/2020: TSH 0.006 08/11/2020: TSH <0.005, free T4 1.46, total T3 117 (71-180), TPO antibodies 146 (0-34), ATA antibodies <1, TRAb 2.92 (0-1.75) 08/04/2020: TSH <0.005  01/28/2020: TSH 0.492  + FH of thyroid ds.:  Brother, sister, maternal aunt, maternal cousin, niece. No FH of thyroid cancer. No h/o radiation tx to head or neck. No recent contrast studies. No steroid use. No herbal supplements. No Biotin supplements or Hair, Skin and Nails vitamins.  She was on biotin in the past but stopped during investigation for thyrotoxicosis.  Pt also has a history of prediabetes. On Progesterone 200 mg at night.  She is postmenopausal.  Labs from [facility] received (02/17/2021) and scanned: TSH 0.707, free T4 1.27  (0.82-1.77), reverse T3 21.5 (9.2-24.1) Higher norepinephrine 1120 (0-874), otherwise normal catecholamines Leptin 82 (apparently normal for BMI) Ferritin 82 Serotonin 90 (0-420) Lipids: 248/41/71/171 Also had an amino acids profile checked BUN/creatinine 12/1.04, GFR 63, glucose 98, alkaline phosphatase 124 (44-121) CBC normal B12 1296, folate more than 20 LH/FSH 54.2/65.4 HbA1c 6.3%, previously 6.3% (01/30/2021) Cortisol 7.2 Estradiol less than 5, DHEA 95 Vitamin D28.9 Free testosterone 1.7 Homocystine 7.6 (0-14.5) ESR 29 Thyroglobulin antibodies <1, TPO antibodies 101 (high), selenium 142 (93-198) Insulin 14.5 Growth hormone 0.1 (0-10)  ROS: + See HPI  Past Medical History:  Diagnosis Date   High cholesterol    History of abnormal cervical Pap smear    Hypertension    Past Surgical History:  Procedure Laterality Date   COLPOSCOPY     Social History   Socioeconomic History   Marital status: Single    Spouse name: Not on file   Number of children: 0   Years of education: Not on file   Highest education level: Not on file  Occupational History   Occupation: Scientist, forensic  Tobacco Use   Smoking status: Never   Smokeless tobacco: Never  Vaping Use   Vaping Use: Never used  Substance and Sexual Activity   Alcohol use: Yes    Comment: occasionally--maybe a drink/month - wine   Drug use: Never   Sexual activity: Not Currently    Birth control/protection: None, Post-menopausal  Other Topics Concern   Not on file  Social History Narrative   Not on file   Social Determinants of Health   Financial Resource Strain: Not on file  Food Insecurity: Not on file  Transportation Needs: Not on file  Physical Activity: Not on file  Stress: Not on file  Social Connections: Not  on file  Intimate Partner Violence: Not on file   Current Outpatient Medications on File Prior to Visit  Medication Sig Dispense Refill   amLODipine (NORVASC) 5 MG tablet 1 tablet      naproxen (EC NAPROSYN) 500 MG EC tablet 1 tablet as needed     progesterone (PROMETRIUM) 100 MG capsule Take by mouth.     No current facility-administered medications on file prior to visit.   Allergies  Allergen Reactions   Methimazole Other (See Comments)   Family History  Problem Relation Age of Onset   Hyperlipidemia Mother    Hypertension Mother    Congestive Heart Failure Father    Autoimmune disease Sister    Hypertension Brother    Dementia Maternal Grandmother    Colon cancer Maternal Grandfather    Hypertension Brother    Hyperlipidemia Brother    Breast cancer Neg Hx    BRCA 1/2 Neg Hx     PE: BP 126/82 (BP Location: Left Arm, Patient Position: Sitting, Cuff Size: Normal)   Pulse 87   Ht 5' 4.75" (1.645 m)   Wt 258 lb 3.2 oz (117.1 kg)   LMP 10/29/2020 (Approximate)   SpO2 97%   BMI 43.30 kg/m  Wt Readings from Last 3 Encounters:  05/10/22 258 lb 3.2 oz (117.1 kg)  02/14/22 274 lb (124.3 kg)  11/06/21 278 lb 12.8 oz (126.5 kg)   Constitutional: overweight, in NAD Eyes: PERRLA, EOMI, no exophthalmos ENT: moist mucous membranes, no thyromegaly, no nodules palpated in lower neck, no cervical lymphadenopathy Cardiovascular: RRR, No MRG Respiratory: CTA B Musculoskeletal: no deformities Skin: moist, warm, no rashes Neurological: no tremor with outstretched hands  ASSESSMENT: 1. Thyroid nodules  2.  Thyrotoxicosis  3.  Hashimoto's thyroiditis  PLAN: 1. Thyroid nodules -I reviewed the images and report of her thyroid ultrasound: The 2 dominant nodules were larger, this being a risk factor for cancer.  However, these nodules may just be signs of inflammation (pseudonodule) developed in the setting of Hashimoto's thyroiditis, as her TPO antibodies were also found to be elevated.  Thyroid nodules are also frequently encountered in the setting of Graves' disease. She did not have ultrasound features indicative of higher risk for cancer: - Hypoechogenicity  (the left nodule is hypoechoic while the right is isoechoic) - microcalcifications (both dominant nodules contains punctate hyperechogenic foci which could be colloid granules, but microcalcifications could not be ruled out) - internal blood flow (the right thyroid dominant nodule also has some internal blood flow)  - taller than wide (none of the nodules) - With irregular margins  (none of the nodules) She does not have a thyroid cancer family history or personal history of radiation therapy to head or neck, all of these favoring benignity. -Last year, since she had thyrotoxicosis with history of thyroid nodules, I suggested a thyroid uptake and scan, but this was not covered by her insurance -At today's visit, we will check another thyroid ultrasound - I we will see her back in 1 year  2.  Thyrotoxicosis -Patient developed thyrotoxicosis at the end of 2021.  She was started on methimazole for Graves' disease but she could not tolerate it due to rash.  She stopped methimazole and reportedly became hypothyroid (I do not have these records) and was started on liothyronine.  She was advised to start 1-2 tablets of 5 mcg daily.  She only started 5 mcg daily, but we tapered this to off. -Repeat TFTs were normal in 08/2021 in 10/2021 -  At today's visit, we will recheck her TFTs -she lost 16 lbs since last OV,  which is intentional, after changing her diet  3.  Hashimoto thyroiditis -She had elevated TPO and TRAb's in the past -We discussed about the possibility of developing hypothyroidism in the future due to history of Hashimoto's thyroiditis  Labs need to be through [place]!  Component     Latest Ref Rng 05/10/2022  TSH     0.450 - 4.500 uIU/mL 1.140   T4,Free(Direct)     0.82 - 1.77 ng/dL 1.24   Triiodothyronine,Free,Serum     2.0 - 4.4 pg/mL 2.6    Normal TFTs.  Thyroid U/S (05/18/2020): Parenchymal Echotexture: Moderately heterogenous  Isthmus: Borderline enlarged measuring 0.8 cm  in diameter, unchanged  Right lobe: Borderline enlarged measuring 5.0 x 2.2 x 1.8 cm, previously 5.2 x 2.0 x 1.8 cm Left lobe: Borderline enlarged measuring 5.5 x 2.4 x 2.0 cm, previously, 5.2 x 2.2 x 1.8 cm _________________________________________________________   Estimated total number of nodules >/= 1 cm: 5  Number of spongiform nodules >/=  2 cm not described below (TR1): 0  Number of mixed cystic and solid nodules >/= 1.5 cm not described below (TR2): 0  _________________________________________________________   The approximally 1.4 x 1.3 x 0.7 cm isoechoic nodule within the thyroid isthmus (labeled 1), is unchanged to slightly increased in size in the interval, previously, 1.2 cm, though with slight differences likely attributable to scan plane projection and potential partial interval cystic degeneration, a typically benign finding. This nodule again does not meet criteria to recommend percutaneous sampling or continued dedicated follow-up.  _________________________________________________________   Nodule # 2:  Prior biopsy: No  Location: Right; Mid  Maximum size: 1.7 cm; Other 2 dimensions: 1.5 x 1.2 cm, previously, 1.5 x 1.5 x 1.1 cm  Composition: solid/almost completely solid (2)  Echogenicity: isoechoic (1)  ACR TI-RADS risk category:  TR3 (3 points). Significant change in size (>/= 20% in two dimensions and minimal increase of 2 mm): Yes Change in features:  Yes nodule no longer appears to contain punctate echogenic foci Change in ACR TI-RADS risk category:  Yes - previously characterized as a TR4 nodule, presently a TR3 nodule. *Given size (>/= 1.5 - 2.4 cm) and appearance, a follow-up ultrasound in 1 year  should be considered based on TI-RADS criteria.  _________________________________________________________   The approximally 1.1 x 0.9 x 0.9 cm isoechoic ill-defined nodule/pseudonodule within the mid, medial aspect of the right lobe of the thyroid (labeled  3), is unchanged compared to the 03/2021 examination, previously, 1.1 cm, and again does not meet criteria to recommend percutaneous sampling or continued dedicated follow-up.   The approximately 0.9 cm partially cystic though predominantly solid isoechoic nodule within the inferior pole the right lobe of the thyroid (labeled 4), is unchanged compared to the 03/2021 examination, previously, 0.8 cm, and again does not meet criteria to recommend percutaneous sampling or continued dedicated follow-up.  _________________________________________________________   The approximally 1.0 x 0.9 x 0.7 cm isoechoic ill-defined nodule/pseudonodule within the superior pole of the left lobe of the thyroid (labeled 5), is unchanged to decreased in size compared to the 03/2021 examination, previously, 1.3 x 1.2 x 1.1 cm, and again does not meet criteria to recommend percutaneous sampling or continued dedicated follow-up. _________________________________________________________   Nodule # 6:  Prior biopsy: No  Location: Left; Inferior  Maximum size: 2.3 cm; Other 2 dimensions: 1.8 x 1.2 cm, previously, 2.3 x 1.8 x 1.7 cm\  Composition:  solid/almost completely solid (2)  Echogenicity: isoechoic (1)  ACR TI-RADS risk category:  TR3 (3 points). Change in features:  Yes nodule now appears isoechoic and no longer appears to contain punctate echogenic foci Change in ACR TI-RADS risk category: Yes previously characterized as a TR5 nodule, presently a TR3 nodule *Given size (>/= 1.5 - 2.4 cm) and appearance, a follow-up ultrasound in 1 year should be considered based on TI-RADS criteria.  _________________________________________________________   IMPRESSION: 1. Similar findings of multinodular goiter. No worrisome new or enlarging thyroid nodules. 2. Nodules labeled #2 and #6, previously meeting imaging criteria to recommend percutaneous sampling, have now been downgraded to TR3 nodules and as such  do not meet imaging criteria to recommend sampling however do meet criteria to recommend annual/biannual surveillance as indicated. 3. Additional discretely measured nodules/pseudo nodules are unchanged compared to the 03/2021 examination and again do not meet criteria to recommend sampling or continued dedicated follow-up.   The above is in keeping with the ACR TI-RADS recommendations - J Am Coll Radiol 2017;14:587-595.    Philemon Kingdom, MD PhD [facility] Endocrinology

## 2022-05-11 LAB — T3, FREE: T3, Free: 2.6 pg/mL (ref 2.0–4.4)

## 2022-05-11 LAB — TSH: TSH: 1.14 u[IU]/mL (ref 0.450–4.500)

## 2022-05-11 LAB — T4, FREE: Free T4: 1.24 ng/dL (ref 0.82–1.77)

## 2022-05-18 ENCOUNTER — Ambulatory Visit
Admission: RE | Admit: 2022-05-18 | Discharge: 2022-05-18 | Disposition: A | Discharge status: Home / Self Care | Payer: 59 | Source: Ambulatory Visit | Attending: Internal Medicine | Admitting: Internal Medicine

## 2022-05-18 DIAGNOSIS — E042 Nontoxic multinodular goiter: Secondary | ICD-10-CM
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
## 2023-02-13 ENCOUNTER — Other Ambulatory Visit (HOSPITAL_COMMUNITY): Payer: Self-pay | Admitting: Family Medicine

## 2023-02-13 DIAGNOSIS — R7303 Prediabetes: Secondary | ICD-10-CM

## 2023-02-13 DIAGNOSIS — E7800 Pure hypercholesterolemia, unspecified: Secondary | ICD-10-CM

## 2023-02-20 ENCOUNTER — Ambulatory Visit: Payer: 59 | Admitting: Obstetrics and Gynecology

## 2023-02-25 NOTE — Progress Notes (Unsigned)
57 y.o. G0P0000 Single African American female here for annual exam.    Off Prometrium for one year.   Having hot flashes, sweatiness, and vaginal dryness.   Using wild yam cream.  Uses lubricant.   Declines Rx for her symptoms.   Dating since October.   Desires STD screening.  Some condom use.   She needs labs to do labs with [facility].  PCP:   Irena Reichmann, DO  Patient's last menstrual period was 10/29/2020 (approximate).           Sexually active: Yes.    The current method of family planning is post menopausal status.    Exercising: Yes.     Cardio and weight training Smoker:  no  Health Maintenance: Pap:  10/16/19  neg: HR HPV neg, 10/07/18 neg: HR HPV neg History of abnormal Pap:  yes, 2017 had colposcopy which was normal,  paps normal since. No treatment to cervix  MMG:  04/11/22 Breast Density Cat B, BI-RADS CAT 4 suspicious.  Bx: Fibroadipose tissue, fat necrosis, granulomatous inflammation and acute inflammation.  Gram stain negative for microorganisms.  She will schedule her mammogram.  Colonoscopy:  2017 polyps removed, 10 years - Dr. Loreta Ave BMD:   n/a  Result  n/a TDaP:  10/07/18 Gardasil:   no HIV: 10/16/19 NR Hep C: 10/16/19 NR Screening Labs:  STD screening with [facility]   reports that she has never smoked. She has never used smokeless tobacco. She reports current alcohol use. She reports that she does not use drugs.  Past Medical History:  Diagnosis Date   High cholesterol    History of abnormal cervical Pap smear    Hypertension     Past Surgical History:  Procedure Laterality Date   BREAST BIOPSY Right 02/2022   COLPOSCOPY      Current Outpatient Medications  Medication Sig Dispense Refill   amLODipine (NORVASC) 5 MG tablet 1 tablet     naproxen (EC NAPROSYN) 500 MG EC tablet 1 tablet as needed     No current facility-administered medications for this visit.    Family History  Problem Relation Age of Onset   Hyperlipidemia Mother     Hypertension Mother    Congestive Heart Failure Father    Autoimmune disease Sister    Hypertension Brother    Dementia Maternal Grandmother    Colon cancer Maternal Grandfather    Hypertension Brother    Hyperlipidemia Brother    Breast cancer Neg Hx    BRCA 1/2 Neg Hx     Review of Systems  All other systems reviewed and are negative.   Exam:   BP 126/74 (BP Location: Right Arm, Patient Position: Sitting, Cuff Size: Large)   Pulse 89   Ht 5\' 5"  (1.651 m)   Wt 252 lb (114.3 kg)   LMP 10/29/2020 (Approximate)   SpO2 99%   BMI 41.93 kg/m     General appearance: alert, cooperative and appears stated age Head: normocephalic, without obvious abnormality, atraumatic Neck: no adenopathy, supple, symmetrical, trachea midline and thyroid normal to inspection and palpation Lungs: clear to auscultation bilaterally Breasts: normal appearance, no masses or tenderness, No nipple retraction or dimpling, No nipple discharge or bleeding, No axillary adenopathy Heart: regular rate and rhythm Abdomen: soft, non-tender; no masses, no organomegaly Extremities: extremities normal, atraumatic, no cyanosis or edema Skin: skin color, texture, turgor normal. No rashes or lesions Lymph nodes: cervical, supraclavicular, and axillary nodes normal. Neurologic: grossly normal  Pelvic: External genitalia:  no lesions  No abnormal inguinal nodes palpated.              Urethra:  normal appearing urethra with no masses, tenderness or lesions              Bartholins and Skenes: normal                 Vagina: normal appearing vagina with normal color and discharge, no lesions              Cervix: no lesions              Pap taken: yes Bimanual Exam:  Uterus:  normal size, contour, position, consistency, mobility, non-tender              Adnexa: no mass, fullness, tenderness              Rectal exam: yes.  Confirms.              Anus:  normal sphincter tone, no lesions  Chaperone was present  for exam:  Warren Lacy, CMA  Assessment:   Well woman visit with gynecologic exam. Menopausal symptoms.   Plan: Mammogram screening discussed. Self breast awareness reviewed. Pap and HR HPV with GC/CT/trich to [facility]. Guidelines for Calcium, Vitamin D, regular exercise program including cardiovascular and weight bearing exercise. STD screening for HIV, syphilis, hep C at [facility]. Will get colonoscopy report from 2017 - Dr. Loreta Ave.  Follow up annually and prn.   After visit summary provided.

## 2023-03-01 ENCOUNTER — Other Ambulatory Visit: Payer: Self-pay | Admitting: Obstetrics and Gynecology

## 2023-03-01 DIAGNOSIS — Z1231 Encounter for screening mammogram for malignant neoplasm of breast: Secondary | ICD-10-CM

## 2023-03-05 ENCOUNTER — Ambulatory Visit (HOSPITAL_COMMUNITY)
Admission: RE | Admit: 2023-03-05 | Discharge: 2023-03-05 | Disposition: A | Discharge status: Home / Self Care | Payer: 59 | Source: Ambulatory Visit | Attending: Family Medicine | Admitting: Family Medicine

## 2023-03-05 DIAGNOSIS — R7303 Prediabetes: Secondary | ICD-10-CM | POA: Insufficient documentation

## 2023-03-05 DIAGNOSIS — E7800 Pure hypercholesterolemia, unspecified: Secondary | ICD-10-CM | POA: Insufficient documentation

## 2023-03-11 ENCOUNTER — Encounter: Payer: Self-pay | Admitting: Obstetrics and Gynecology

## 2023-03-11 ENCOUNTER — Other Ambulatory Visit: Payer: Self-pay | Admitting: Obstetrics and Gynecology

## 2023-03-11 ENCOUNTER — Ambulatory Visit (INDEPENDENT_AMBULATORY_CARE_PROVIDER_SITE_OTHER): Payer: 59 | Admitting: Obstetrics and Gynecology

## 2023-03-11 VITALS — BP 126/74 | HR 89 | Ht 65.0 in | Wt 252.0 lb

## 2023-03-11 DIAGNOSIS — Z114 Encounter for screening for human immunodeficiency virus [HIV]: Secondary | ICD-10-CM

## 2023-03-11 DIAGNOSIS — Z113 Encounter for screening for infections with a predominantly sexual mode of transmission: Secondary | ICD-10-CM

## 2023-03-11 DIAGNOSIS — Z1159 Encounter for screening for other viral diseases: Secondary | ICD-10-CM

## 2023-03-11 DIAGNOSIS — Z124 Encounter for screening for malignant neoplasm of cervix: Secondary | ICD-10-CM

## 2023-03-11 DIAGNOSIS — Z01419 Encounter for gynecological examination (general) (routine) without abnormal findings: Secondary | ICD-10-CM

## 2023-03-13 NOTE — Patient Instructions (Signed)

## 2023-03-16 LAB — IGP,CTNGTV,APT HPV
Chlamydia, Nuc. Acid Amp: NEGATIVE
Gonococcus, Nuc. Acid Amp: NEGATIVE
HPV Aptima: NEGATIVE
PAP Smear Comment: 0
Trich vag by NAA: NEGATIVE

## 2023-03-19 LAB — HEPATITIS C ANTIBODY: Hep C Virus Ab: NONREACTIVE

## 2023-03-19 LAB — RPR: RPR Ser Ql: NONREACTIVE

## 2023-03-19 LAB — HIV ANTIBODY (ROUTINE TESTING W REFLEX): HIV Screen 4th Generation wRfx: NONREACTIVE

## 2023-04-30 ENCOUNTER — Ambulatory Visit
Admission: RE | Admit: 2023-04-30 | Discharge: 2023-04-30 | Disposition: A | Discharge status: Home / Self Care | Payer: 59 | Source: Ambulatory Visit | Attending: Obstetrics and Gynecology | Admitting: Obstetrics and Gynecology

## 2023-04-30 DIAGNOSIS — Z1231 Encounter for screening mammogram for malignant neoplasm of breast: Secondary | ICD-10-CM

## 2023-05-13 ENCOUNTER — Ambulatory Visit: Payer: 59 | Admitting: Internal Medicine

## 2023-07-18 ENCOUNTER — Encounter: Payer: Self-pay | Admitting: Internal Medicine

## 2023-07-18 ENCOUNTER — Ambulatory Visit (INDEPENDENT_AMBULATORY_CARE_PROVIDER_SITE_OTHER): Payer: 59 | Admitting: Internal Medicine

## 2023-07-18 VITALS — BP 122/80 | HR 62 | Ht 65.0 in | Wt 269.4 lb

## 2023-07-18 DIAGNOSIS — E0590 Thyrotoxicosis, unspecified without thyrotoxic crisis or storm: Secondary | ICD-10-CM

## 2023-07-18 DIAGNOSIS — E063 Autoimmune thyroiditis: Secondary | ICD-10-CM

## 2023-07-18 DIAGNOSIS — E042 Nontoxic multinodular goiter: Secondary | ICD-10-CM

## 2023-07-18 NOTE — Patient Instructions (Signed)
Please stop at the lab.  I will order a thyroid U/S.  Please come back for a follow-up appointment in 1 year.

## 2023-07-19 ENCOUNTER — Encounter: Payer: Self-pay | Admitting: Internal Medicine

## 2023-07-19 LAB — TSH: TSH: 2.62 u[IU]/mL (ref 0.450–4.500)

## 2023-07-19 LAB — T4, FREE: Free T4: 1.29 ng/dL (ref 0.82–1.77)

## 2023-07-19 LAB — T3, FREE: T3, Free: 2.9 pg/mL (ref 2.0–4.4)

## 2023-07-27 LAB — AMB RESULTS CONSOLE CBG: Glucose: 130

## 2023-07-27 NOTE — Progress Notes (Signed)
No SDOH insecurity.

## 2023-07-29 ENCOUNTER — Other Ambulatory Visit: Payer: 59

## 2023-08-01 ENCOUNTER — Ambulatory Visit: Payer: 59 | Admitting: Internal Medicine

## 2023-08-01 ENCOUNTER — Ambulatory Visit
Admission: RE | Admit: 2023-08-01 | Discharge: 2023-08-01 | Disposition: A | Discharge status: Home / Self Care | Payer: 59 | Source: Ambulatory Visit | Attending: Internal Medicine | Admitting: Internal Medicine

## 2023-08-01 DIAGNOSIS — E042 Nontoxic multinodular goiter: Secondary | ICD-10-CM

## 2023-08-22 ENCOUNTER — Encounter: Payer: Self-pay | Admitting: *Deleted

## 2023-08-22 NOTE — Progress Notes (Signed)
Pt attended 07/27/23 screening event where her b/p was 122/84 and her blood sugar was 130. At the event, the pt did not identify any SDOH insecurities, and the pt confirmed her PCP is Dr. Irena Reichmann at [facility]  Chart review documents pt was last seen by Dr. Thomasena Edis on 02/08/23; and pt has had specialty appt referred by pt's PCP, including endocrinology on 07/18/23. Future [facility] appt are not visible in [facility] to be able to document any future PCP appt. No additional health equity team support indicated at this time.

## 2023-08-26 ENCOUNTER — Encounter: Payer: Self-pay | Admitting: Obstetrics and Gynecology

## 2023-08-27 NOTE — Telephone Encounter (Signed)
Call placed to patient, left detailed message, ok per dpr. Advised OV recommended for further evaluation. Return call to office, [phone], opt 1 to schedule.

## 2023-09-01 NOTE — Progress Notes (Signed)
No new SDOH needs since 07/27/23

## 2023-09-02 ENCOUNTER — Ambulatory Visit: Payer: 59 | Admitting: Obstetrics and Gynecology

## 2023-09-09 ENCOUNTER — Encounter: Payer: Self-pay | Admitting: Obstetrics and Gynecology

## 2023-09-09 ENCOUNTER — Ambulatory Visit (INDEPENDENT_AMBULATORY_CARE_PROVIDER_SITE_OTHER): Payer: 59 | Admitting: Obstetrics and Gynecology

## 2023-09-09 VITALS — BP 128/82 | HR 68 | Ht 65.0 in | Wt 273.0 lb

## 2023-09-09 DIAGNOSIS — Z124 Encounter for screening for malignant neoplasm of cervix: Secondary | ICD-10-CM | POA: Diagnosis not present

## 2023-09-09 DIAGNOSIS — N950 Postmenopausal bleeding: Secondary | ICD-10-CM | POA: Diagnosis not present

## 2023-09-09 NOTE — Patient Instructions (Signed)
Endometrial Biopsy  An endometrial biopsy is a procedure where a tissue sample is removed from the lining of the uterus. This lining is called the endometrium. The tissue sample is then sent to a lab for testing. You may have this type of biopsy to check for: Cancer. Infection. Growths called polyps. Uterine bleeding that can't be explained. Tell a health care provider about: Any allergies you have. All medicines you're taking including vitamins, herbs, eye drops, creams, and over-the-counter medicines. Any problems you or family members have had with anesthesia. Any bleeding problems you have. Any surgeries you have had. Any medical problems you have. Whether you're pregnant or may be pregnant. What are the risks? Your health care provider will talk with you about risks. These may include: Bleeding. Infection. Allergic reactions to medicines. Damage to the wall of the uterus. This is rare. What happens before the procedure? Keep track of your period. You may need to have this biopsy when you're not having your period. Ask your provider about: Changing or stopping your regular medicines. These include any diabetes medicines or blood thinners you take. Taking medicines such as aspirin and ibuprofen. These medicines can thin your blood. Do not take them unless your provider tells you to. Taking over-the-counter medicines, vitamins, herbs, and supplements. Bring a pad with you. You may need to wear one after the biopsy. Plan to have a responsible adult take you home from the hospital or clinic. You won't be allowed to drive. What happens during the procedure? A tool will be put into your vagina to hold it open. This helps your provider see the cervix. The cervix is the lowest part of the uterus. Your cervix will be cleaned with a solution that kills germs. You will be given anesthesia. This keeps you from feeling pain. It will numb your cervix. A tool called forceps will be used to  hold your cervix steady. A thin tool called a uterine sound will be put through your cervix. It will be used to: Find the length of your uterus. Find where to take the sample from. A soft tube called a catheter will be put into your uterus. The catheter will remove a tissue sample. The tube and tools will be removed. The sample will be sent to a lab for testing. The procedure may vary among providers and hospitals. What happens after the procedure? Your blood pressure, heart rate, breathing rate, and blood oxygen level will be monitored until you leave the hospital or clinic. It's up to you to get the results of your procedure. Ask your provider, or the department that is doing the procedure, when your results will be ready. This information is not intended to replace advice given to you by your health care provider. Make sure you discuss any questions you have with your health care provider. Document Revised: 12/25/2022 Document Reviewed: 12/25/2022 Elsevier Patient Education  2024 [employer] Inc.   Postmenopausal Bleeding Postmenopausal bleeding is any bleeding that a woman has after she has entered menopause. Menopause is the end of a woman's fertile years. After menopause, a woman no longer ovulates and does not have menstrual periods. Therefore, she should no longer have bleeding from her vagina. Postmenopausal bleeding may have various causes, including: Menopausal hormone therapy (MHT). Endometrial atrophy. After menopause, low estrogen hormone levels cause the membrane that lines the uterus (endometrium) to become thin. You may have bleeding as the endometrium thins. Endometrial hyperplasia. This condition is caused by excess estrogen hormones and low levels of  progesterone hormones. The excess estrogen causes the endometrium to thicken, which can lead to bleeding. In some cases, this can lead to cancer of the uterus. Endometrial cancer. Noncancerous growths (polyps) on the endometrium,  the lining of the uterus, or the cervix. Uterine fibroids. These are noncancerous growths in or around the uterus muscle tissue that can cause heavy bleeding. Any type of postmenopausal bleeding, even if it appears to be a typical menstrual period, should be checked by your health care provider. Treatment will depend on the cause of the bleeding. Follow these instructions at home:  Pay attention to any changes in your symptoms. Let your health care provider know about them. Avoid using tampons and douches as told by your health care provider. Change your pads regularly. Get regular pelvic exams, including Pap tests, as told by your health care provider. Take iron supplements as told by your health care provider. Take over-the-counter and prescription medicines only as told by your health care provider. Keep all follow-up visits. This is important. Contact a health care provider if: You have new bleeding from the vagina after menopause. You have pain in your abdomen. Get help right away if: You have a fever or chills. You have severe pain with bleeding. You are passing blood clots. You have heavy bleeding, need more than 1 pad an hour, and have never experienced this before. You have headaches or feel faint or dizzy. Summary Postmenopausal bleeding is any bleeding that a woman has after she has entered into menopause. Postmenopausal bleeding may have various causes. Treatment will depend on the cause of the bleeding. Any type of postmenopausal bleeding, even if it appears to be a typical menstrual period, should be checked by your health care provider. Be sure to pay attention to any changes in your symptoms and keep all follow-up visits. This information is not intended to replace advice given to you by your health care provider. Make sure you discuss any questions you have with your health care provider. Document Revised: 03/31/2020 Document Reviewed: 03/31/2020 Elsevier Patient  Education  2024 [employer].

## 2023-09-09 NOTE — Progress Notes (Unsigned)
GYNECOLOGY  VISIT   HPI: 57 y.o.   Single  African American female   G0P0000 with Patient's last menstrual period was 10/29/2020 (approximate).   here for: bleeding- pt has not had cycle in three years. Had bleeding starting 08/25/23 that felt like a regular cycle for four days with breast tenderness.  No pelvic pain.   Not taking any hormone therapy.   Same partner for over one year.   Labs 02/17/21:  FSH 62.3 , estradiol 14.3.   GYNECOLOGIC HISTORY: Patient's last menstrual period was 10/29/2020 (approximate). Contraception:  PMP Menopausal hormone therapy:  n/a Pap:      Component Value Date/Time   DIAGPAP  10/16/2019 0831    - Negative for intraepithelial lesion or malignancy (NILM)   DIAGPAP  10/07/2018 0000    NEGATIVE FOR INTRAEPITHELIAL LESIONS OR MALIGNANCY.   HPVHIGH Negative 10/16/2019 0831   ADEQPAP  10/16/2019 0831    Satisfactory for evaluation; transformation zone component ABSENT.   ADEQPAP  10/07/2018 0000    Satisfactory for evaluation  endocervical/transformation zone component PRESENT.   History of abnormal Pap or positive HPV:  yes,  2017 had colposcopy which was normal,  paps normal since. No treatment to cervix  Mammogram:  04/30/23 Breast density Cat B, BI-RADS CAT 1 neg        OB History     Gravida  0   Para  0   Term  0   Preterm  0   AB  0   Living  0      SAB  0   IAB  0   Ectopic  0   Multiple  0   Live Births  0              Patient Active Problem List   Diagnosis Date Noted   Multiple thyroid nodules 07/06/2021   Hashimoto's disease 07/06/2021   Thyrotoxicosis without thyroid storm 07/06/2021   Hyperlipidemia 10/09/2018    Past Medical History:  Diagnosis Date   High cholesterol    History of abnormal cervical Pap smear    Hypertension     Past Surgical History:  Procedure Laterality Date   BREAST BIOPSY Right 02/2022   COLPOSCOPY      Current Outpatient Medications  Medication Sig Dispense Refill    amLODipine (NORVASC) 5 MG tablet 1 tablet     naproxen (EC NAPROSYN) 500 MG EC tablet 1 tablet as needed     No current facility-administered medications for this visit.     ALLERGIES: Methimazole  Family History  Problem Relation Age of Onset   Hyperlipidemia Mother    Hypertension Mother    Congestive Heart Failure Father    Autoimmune disease Sister    Hypertension Brother    Dementia Maternal Grandmother    Colon cancer Maternal Grandfather    Hypertension Brother    Hyperlipidemia Brother    Breast cancer Neg Hx    BRCA 1/2 Neg Hx     Social History   Socioeconomic History   Marital status: Single    Spouse name: Not on file   Number of children: 0   Years of education: Not on file   Highest education level: Not on file  Occupational History   Occupation: Air cabin crew  Tobacco Use   Smoking status: Never   Smokeless tobacco: Never  Vaping Use   Vaping status: Never Used  Substance and Sexual Activity   Alcohol use: Yes    Comment: occasionally--maybe  a drink/month - wine   Drug use: Never   Sexual activity: Yes    Birth control/protection: None, Post-menopausal  Other Topics Concern   Not on file  Social History Narrative   Not on file   Social Determinants of Health   Financial Resource Strain: Not on file  Food Insecurity: No Food Insecurity (07/27/2023)   Hunger Vital Sign    Worried About Running Out of Food in the Last Year: Never true    Ran Out of Food in the Last Year: Never true  Transportation Needs: No Transportation Needs (07/27/2023)   PRAPARE - Administrator, Civil Service (Medical): No    Lack of Transportation (Non-Medical): No  Physical Activity: Not on file  Stress: Not on file  Social Connections: Not on file  Intimate Partner Violence: Unknown (07/27/2023)   Humiliation, Afraid, Rape, and Kick questionnaire    Fear of Current or Ex-Partner: No    Emotionally Abused: No    Physically Abused: Not on file     Sexually Abused: No    Review of Systems  All other systems reviewed and are negative.   PHYSICAL EXAMINATION:   BP 128/82 (BP Location: Left Arm, Patient Position: Sitting, Cuff Size: Large)   Pulse 68   Ht 5\' 5"  (1.651 m)   Wt 273 lb (123.8 kg)   LMP 10/29/2020 (Approximate)   SpO2 98%   BMI 45.43 kg/m     General appearance: alert, cooperative and appears stated age Head: Normocephalic, without obvious abnormality, atraumatic Neck: no adenopathy, supple, symmetrical, trachea midline and thyroid normal to inspection and palpation Lungs: clear to auscultation bilaterally Breasts: normal appearance, no masses or tenderness, No nipple retraction or dimpling, No nipple discharge or bleeding, No axillary or supraclavicular adenopathy Heart: regular rate and rhythm Abdomen: soft, non-tender, no masses,  no organomegaly Extremities: extremities normal, atraumatic, no cyanosis or edema Skin: Skin color, texture, turgor normal. No rashes or lesions Lymph nodes: Cervical, supraclavicular, and axillary nodes normal. No abnormal inguinal nodes palpated Neurologic: Grossly normal  Pelvic: External genitalia:  no lesions              Urethra:  normal appearing urethra with no masses, tenderness or lesions              Bartholins and Skenes: normal                 Vagina: normal appearing vagina with normal color and discharge, no lesions              Cervix: no lesions                Bimanual Exam:  Uterus:  normal size, contour, position, consistency, mobility, non-tender              Adnexa: no mass, fullness, tenderness              Rectal exam: {yes no:314532}.  Confirms.              Anus:  normal sphincter tone, no lesions  Chaperone was present for exam:  {BSCHAPERONE:31226::"Emily F, CMA"}  {LABS (Optional):23779}  ASSESSMENT:    PLAN:      ***  total time was spent for this patient encounter, including preparation, face-to-face counseling with the patient,  coordination of care, and documentation of the encounter.  An After Visit Summary was provided to the patient.

## 2023-09-13 LAB — IGP, APTIMA HPV
HPV Aptima: NEGATIVE
PAP Smear Comment: 0

## 2023-09-24 NOTE — Progress Notes (Unsigned)
GYNECOLOGY  VISIT   HPI: 57 y.o.   Single  African American female   G0P0000 with Patient's last menstrual period was 10/29/2020 (approximate).   here for: U/S and possible endometrial biopsy.  Patient has had postmenopausal bleeding.     GYNECOLOGIC HISTORY: Patient's last menstrual period was 10/29/2020 (approximate). Contraception:  PMP Menopausal hormone therapy:  n/a Last 2 paps:  09/09/23 ASCUS: HR HPV neg, 03/11/23 neg: HR HPV neg History of abnormal Pap or positive HPV:  yes, 2017 had colposcopy which was normal,  paps normal since. No treatment to cervix  Mammogram:  04/30/23 Breast density Cat B, BI-RADS CAT 1 neg         OB History     Gravida  0   Para  0   Term  0   Preterm  0   AB  0   Living  0      SAB  0   IAB  0   Ectopic  0   Multiple  0   Live Births  0              Patient Active Problem List   Diagnosis Date Noted   Multiple thyroid nodules 07/06/2021   Hashimoto's disease 07/06/2021   Thyrotoxicosis without thyroid storm 07/06/2021   Hyperlipidemia 10/09/2018    Past Medical History:  Diagnosis Date   High cholesterol    History of abnormal cervical Pap smear    Hypertension     Past Surgical History:  Procedure Laterality Date   BREAST BIOPSY Right 02/2022   COLPOSCOPY      Current Outpatient Medications  Medication Sig Dispense Refill   amLODipine (NORVASC) 5 MG tablet 1 tablet     naproxen (EC NAPROSYN) 500 MG EC tablet 1 tablet as needed     No current facility-administered medications for this visit.     ALLERGIES: Methimazole  Family History  Problem Relation Age of Onset   Hyperlipidemia Mother    Hypertension Mother    Congestive Heart Failure Father    Autoimmune disease Sister    Hypertension Brother    Dementia Maternal Grandmother    Colon cancer Maternal Grandfather    Hypertension Brother    Hyperlipidemia Brother    Breast cancer Neg Hx    BRCA 1/2 Neg Hx     Social History    Socioeconomic History   Marital status: Single    Spouse name: Not on file   Number of children: 0   Years of education: Not on file   Highest education level: Not on file  Occupational History   Occupation: Air cabin crew  Tobacco Use   Smoking status: Never   Smokeless tobacco: Never  Vaping Use   Vaping status: Never Used  Substance and Sexual Activity   Alcohol use: Yes    Comment: occasionally--maybe a drink/month - wine   Drug use: Never   Sexual activity: Yes    Birth control/protection: None, Post-menopausal  Other Topics Concern   Not on file  Social History Narrative   Not on file   Social Determinants of Health   Financial Resource Strain: Not on file  Food Insecurity: No Food Insecurity (07/27/2023)   Hunger Vital Sign    Worried About Running Out of Food in the Last Year: Never true    Ran Out of Food in the Last Year: Never true  Transportation Needs: No Transportation Needs (07/27/2023)   PRAPARE - Transportation    Lack  of Transportation (Medical): No    Lack of Transportation (Non-Medical): No  Physical Activity: Not on file  Stress: Not on file  Social Connections: Not on file  Intimate Partner Violence: Unknown (07/27/2023)   Humiliation, Afraid, Rape, and Kick questionnaire    Fear of Current or Ex-Partner: No    Emotionally Abused: No    Physically Abused: Not on file    Sexually Abused: No    Review of Systems  All other systems reviewed and are negative.   PHYSICAL EXAMINATION:   Ht 5\' 5"  (1.651 m)   Wt 273 lb (123.8 kg)   LMP 10/29/2020 (Approximate)   BMI 45.43 kg/m     General appearance: alert, cooperative and appears stated age   Pelvic US Uterus 7.16 x 4.21 x 3.09 cm.  Fibroid:  0.95 cm anterior, subserosal.  EMS 3.00 mm.  No masses.  Left ovary:  1.60 x 1.18 x 0.84 cm.  Right ovary:   1.86 x 1.18 x 0.84 cm.  No adnexal masses.  No free fluid.   EMB: Consent for procedure. Sterile prep with Hibiclens  Paracervical  block with 1% lidocaine 10 cc, lot [id number]      , expiration 06/2025 Tenaculum to anterior cervical lip. Pipelle passed to    7      cm twice.   Tissue to pathology.  Minimal EBL. No complications.   Chaperone was present for exam:  Warren Lacy, CMA  ASSESSMENT:  Postmenopausal bleeding.  ASCUS pap, neg HR HPV .  PLAN:  Literature regarding evaluation of postmenopausal bleeding reviewed.  [place] images and report discussed.  Fibroid formation reviewed.  EMB to [facility].  Final plan to follow.  Next pap and HR HPV in 3 years.   20 min total time was spent for this patient encounter, including preparation, face-to-face counseling with the patient, coordination of care, and documentation of the encounter in addition to doing the endometrial biopsy.

## 2023-10-02 ENCOUNTER — Encounter: Payer: Self-pay | Admitting: *Deleted

## 2023-10-02 NOTE — Progress Notes (Addendum)
Pt attended 09/01/2023 screening event where her b/p was 139/85 and her blood sugar was 97. At the event, the pt confirmed her PCP is Dr. Irena Reichmann, at [facility], that she does have insurance, does not smoke and did not identify any SDOH insecurities. Chart review indicates pt saw her GYN physician on 09/09/23 and her b/p was 128/82. Call made to pt to confirm ongoing healthcare access, & VM left. Pt's PCP office does not document in [facility]-visible encounters so Dr. France Ravens office contacted and most recent OV was 09/23/23. Pt did return call and confirmed she still sees Dr. Thomasena Edis and had no unmet healthcare access or SDOH needs.  No additional health equity team support indicated at this time.

## 2023-10-08 ENCOUNTER — Encounter: Payer: Self-pay | Admitting: Obstetrics and Gynecology

## 2023-10-08 ENCOUNTER — Ambulatory Visit (INDEPENDENT_AMBULATORY_CARE_PROVIDER_SITE_OTHER): Payer: 59 | Admitting: Obstetrics and Gynecology

## 2023-10-08 ENCOUNTER — Ambulatory Visit: Payer: 59

## 2023-10-08 VITALS — BP 128/84 | HR 79 | Ht 65.0 in | Wt 273.0 lb

## 2023-10-08 DIAGNOSIS — N950 Postmenopausal bleeding: Secondary | ICD-10-CM | POA: Diagnosis not present

## 2023-10-08 NOTE — Patient Instructions (Signed)
 Endometrial Biopsy  An endometrial biopsy is a procedure where a tissue sample is removed from the lining of the uterus. This lining is called the endometrium. The tissue sample is then sent to a lab for testing. You may have this type of biopsy to check for: Cancer. Infection. Growths called polyps. Uterine bleeding that can't be explained. Tell a health care provider about: Any allergies you have. All medicines you're taking including vitamins, herbs, eye drops, creams, and over-the-counter medicines. Any problems you or family members have had with anesthesia. Any bleeding problems you have. Any surgeries you have had. Any medical problems you have. Whether you're pregnant or may be pregnant. What are the risks? Your health care provider will talk with you about risks. These may include: Bleeding. Infection. Allergic reactions to medicines. Damage to the wall of the uterus. This is rare. What happens before the procedure? Keep track of your period. You may need to have this biopsy when you're not having your period. Ask your provider about: Changing or stopping your regular medicines. These include any diabetes medicines or blood thinners you take. Taking medicines such as aspirin and ibuprofen. These medicines can thin your blood. Do not take them unless your provider tells you to. Taking over-the-counter medicines, vitamins, herbs, and supplements. Bring a pad with you. You may need to wear one after the biopsy. Plan to have a responsible adult take you home from the hospital or clinic. You won't be allowed to drive. What happens during the procedure? A tool will be put into your vagina to hold it open. This helps your provider see the cervix. The cervix is the lowest part of the uterus. Your cervix will be cleaned with a solution that kills germs. You will be given anesthesia. This keeps you from feeling pain. It will numb your cervix. A tool called forceps will be used to  hold your cervix steady. A thin tool called a uterine sound will be put through your cervix. It will be used to: Find the length of your uterus. Find where to take the sample from. A soft tube called a catheter will be put into your uterus. The catheter will remove a tissue sample. The tube and tools will be removed. The sample will be sent to a lab for testing. The procedure may vary among providers and hospitals. What happens after the procedure? Your blood pressure, heart rate, breathing rate, and blood oxygen level will be monitored until you leave the hospital or clinic. It's up to you to get the results of your procedure. Ask your provider, or the department that is doing the procedure, when your results will be ready. This information is not intended to replace advice given to you by your health care provider. Make sure you discuss any questions you have with your health care provider. Document Revised: 12/25/2022 Document Reviewed: 12/25/2022 Elsevier Patient Education  2024 [employer].

## 2023-10-14 LAB — ANATOMIC PATHOLOGY REPORT

## 2024-04-06 ENCOUNTER — Other Ambulatory Visit: Payer: Self-pay | Admitting: Obstetrics and Gynecology

## 2024-04-06 DIAGNOSIS — Z1231 Encounter for screening mammogram for malignant neoplasm of breast: Secondary | ICD-10-CM

## 2024-05-04 ENCOUNTER — Ambulatory Visit
Admission: RE | Admit: 2024-05-04 | Discharge: 2024-05-04 | Disposition: A | Discharge status: Home / Self Care | Source: Ambulatory Visit | Attending: Obstetrics and Gynecology | Admitting: Obstetrics and Gynecology

## 2024-05-04 DIAGNOSIS — Z1231 Encounter for screening mammogram for malignant neoplasm of breast: Secondary | ICD-10-CM

## 2024-05-06 ENCOUNTER — Ambulatory Visit: Payer: Self-pay | Admitting: Obstetrics and Gynecology

## 2024-05-08 ENCOUNTER — Other Ambulatory Visit: Payer: Self-pay | Admitting: Medical Genetics

## 2024-05-08 DIAGNOSIS — Z006 Encounter for examination for normal comparison and control in clinical research program: Secondary | ICD-10-CM

## 2024-05-25 LAB — GENECONNECT MOLECULAR SCREEN: Genetic Analysis Overall Interpretation: NEGATIVE

## 2024-06-04 NOTE — Progress Notes (Signed)
 58 y.o. G0P0000 Single African American female here for annual exam.    Had postmenopausal bleeding evaluation last year.  US  showed small fibroids and EMB was benign.  No more bleeding.     With same partner.   Using lubricant.   Wants STD screening through [facility].    Still having hot flashes.  Tolerating it.  Wants a natural approach.   PCP: Gerome Brunet, DO   Patient's last menstrual period was 10/29/2020 (approximate).           Sexually active: Yes.    The current method of family planning is post menopausal status.    Menopausal hormone therapy:  None Exercising: Yes.    Step aerobics and walking 3-4 times a week Smoker:  no  OB History  Gravida Para Term Preterm AB Living  0 0 0 0 0 0  SAB IAB Ectopic Multiple Live Births  0 0 0 0 0     HEALTH MAINTENANCE: Last 2 paps:  09/09/23 atypical squamous cells, HR HPV neg, 03/11/23 neg HPV neg  History of abnormal Pap or positive HPV:  yes Mammogram:   05/04/24 Breast Density Cat B, Bi-Rads 1 Colonoscopy:   2017 polyps removed, 10 years - Dr. Kristie  Bone Density:  N/A  Result  N/A   Immunization History  Administered Date(s) Administered   Moderna Covid-19 Vaccine Bivalent Booster 55yrs & up 09/07/2021   Moderna SARS-COV2 Booster Vaccination 08/30/2020   Moderna Sars-Covid-2 Vaccination 12/31/2019, 02/02/2020   Tdap 10/07/2018      reports that she has never smoked. She has never used smokeless tobacco. She reports current alcohol use. She reports that she does not use drugs.  Past Medical History:  Diagnosis Date   High cholesterol    History of abnormal cervical Pap smear    Hypertension     Past Surgical History:  Procedure Laterality Date   BREAST BIOPSY Right 02/2022   COLPOSCOPY      Current Outpatient Medications  Medication Sig Dispense Refill   amLODipine (NORVASC) 5 MG tablet 1 tablet     naproxen (EC NAPROSYN) 500 MG EC tablet 1 tablet as needed     No current facility-administered  medications for this visit.    ALLERGIES: Methimazole  Family History  Problem Relation Age of Onset   Hyperlipidemia Mother    Hypertension Mother    Congestive Heart Failure Father    Autoimmune disease Sister    Hypertension Brother    Dementia Maternal Grandmother    Colon cancer Maternal Grandfather    Hypertension Brother    Hyperlipidemia Brother    Breast cancer Neg Hx    BRCA 1/2 Neg Hx     Review of Systems  All other systems reviewed and are negative.   PHYSICAL EXAM:  BP (!) 142/74 (BP Location: Right Arm, Patient Position: Sitting)   Pulse 71   Ht 5' 5.75 (1.67 m)   Wt 295 lb (133.8 kg)   LMP 10/29/2020 (Approximate)   SpO2 99%   BMI 47.98 kg/m     General appearance: alert, cooperative and appears stated age Head: normocephalic, without obvious abnormality, atraumatic Neck: no adenopathy, supple, symmetrical, trachea midline and thyroid  normal to inspection and palpation Lungs: clear to auscultation bilaterally Breasts: normal appearance, no masses or tenderness, No nipple retraction or dimpling, No nipple discharge or bleeding, No axillary adenopathy Heart: regular rate and rhythm Abdomen: soft, non-tender; no masses, no organomegaly Extremities: extremities normal, atraumatic, no cyanosis or edema  Skin: skin color, texture, turgor normal. No rashes or lesions Lymph nodes: cervical, supraclavicular, and axillary nodes normal. Neurologic: grossly normal  Pelvic: External genitalia:  no lesions              No abnormal inguinal nodes palpated.              Urethra:  normal appearing urethra with no masses, tenderness or lesions              Bartholins and Skenes: normal                 Vagina: normal appearing vagina with normal color and discharge, no lesions              Cervix: no lesions              Pap taken: no Bimanual Exam:  Uterus:  normal size, contour, position, consistency, mobility, non-tender              Adnexa: no mass, fullness,  tenderness              Rectal exam: yes.  Confirms.              Anus:  normal sphincter tone, no lesions  Chaperone was present for exam:  Reena DEL, CMA  ASSESSMENT: Well woman visit with gynecologic exam. STD screening, HIV screening, Viral screening.  Menopausal symptoms.  PHQ-2-9: 0  PLAN: Mammogram screening discussed. Self breast awareness reviewed. Pap and HRV collected:  no.  Due in 2027.  Guidelines for Calcium, Vitamin D, regular exercise program including cardiovascular and weight bearing exercise. Medication refills:  NA. [place] suggested for treating vasomotor symptoms.  Wants to do her labs at [facility] on [address]:  HIV, hepatitis C aby, RPR.   Nuswab collected for GC/CT/trich testing.  Follow up:  yearly and prn.

## 2024-06-08 ENCOUNTER — Ambulatory Visit (INDEPENDENT_AMBULATORY_CARE_PROVIDER_SITE_OTHER): Admitting: Obstetrics and Gynecology

## 2024-06-08 ENCOUNTER — Other Ambulatory Visit: Payer: Self-pay | Admitting: *Deleted

## 2024-06-08 ENCOUNTER — Encounter: Payer: Self-pay | Admitting: Obstetrics and Gynecology

## 2024-06-08 ENCOUNTER — Telehealth: Payer: Self-pay | Admitting: Obstetrics and Gynecology

## 2024-06-08 ENCOUNTER — Other Ambulatory Visit: Payer: Self-pay

## 2024-06-08 VITALS — BP 142/74 | HR 71 | Ht 65.75 in | Wt 295.0 lb

## 2024-06-08 DIAGNOSIS — Z01419 Encounter for gynecological examination (general) (routine) without abnormal findings: Secondary | ICD-10-CM

## 2024-06-08 DIAGNOSIS — Z1159 Encounter for screening for other viral diseases: Secondary | ICD-10-CM

## 2024-06-08 DIAGNOSIS — Z113 Encounter for screening for infections with a predominantly sexual mode of transmission: Secondary | ICD-10-CM

## 2024-06-08 DIAGNOSIS — Z114 Encounter for screening for human immunodeficiency virus [HIV]: Secondary | ICD-10-CM

## 2024-06-08 DIAGNOSIS — Z1331 Encounter for screening for depression: Secondary | ICD-10-CM | POA: Diagnosis not present

## 2024-06-08 NOTE — Telephone Encounter (Signed)
 Orders placed.

## 2024-06-08 NOTE — Patient Instructions (Addendum)
 Calcium in Foods Calcium is a mineral in the body. Of all the minerals in your body, you have the most calcium. Most of the body's calcium supply is stored in bones and teeth. Calcium helps many parts of the body work, including: Blood and blood vessels. Nerves. Hormones. Muscles. Bones and teeth. When your calcium stores are low, you may be at risk for low bone mass, bone loss, and broken bones. When you get enough calcium, it helps to support strong bones and teeth throughout your life. Calcium is especially important for: Children during growth spurts. Females during adolescence. Females who are pregnant or breastfeeding. Females after their menstrual cycle stops (postmenopausal). Females whose menstrual cycle has stopped because of an eating disorder or regular intense exercise. People who can't eat or digest dairy products. People who eat a vegan diet. Recommended daily amounts of calcium: Females (ages 87 to 55): 1,000 mg per day. Females (ages 35 and older): 1,200 mg per day. Males (ages 53 to 45): 1,000 mg per day. Males (ages 43 and older): 1,200 mg per day. Females (ages 88 to 28): 1,300 mg per day. Males (ages 41 to 56): 1,300 mg per day. General information Eat foods that are high in calcium. Try to get most of your calcium from food. Some people may benefit from taking calcium supplements. Check with your health care provider or an expert in healthy eating called a dietitian before starting any calcium supplements. Calcium supplements may interact with certain medicines. Too much calcium may cause other health problems, such as trouble pooping and kidney stones. For the body to absorb calcium, it needs vitamin D. Sources of vitamin D include: Skin exposure to direct sunlight. Foods, such as egg yolks, liver, mushrooms, saltwater fish, and fortified milk. Vitamin D supplements. Check with your provider or dietitian before starting any vitamin D supplements. The amount of  calcium that is absorbed in the body varies with type of food. Talk to a dietitian about what foods are best for you, especially if you are eat a vegan diet or don't eat dairy. What foods are high in calcium?  Foods that are high in calcium contain more than 100 milligrams per serving. Fruits Fortified orange juice or other fruit juice, 300 mg per 8 oz (237 mL) serving. Vegetables Collard greens, 260 mg per 1 cup (130 g) serving, cooked. Kale, 180 mg per 1 cup (118 g) serving, cooked. Bok choy, 180 mg per 1 cup (170 g) serving, cooked Grains Fortified frozen waffles, 200 mg in 2 waffles. Oatmeal, 180 mg in 1 cup (234 g) serving, cooked. Fortified white bread, 175 mg per slice. Meats and other proteins Sardines, canned with bones, 350 mg per 3.75 oz (92 g) serving. Salmon, canned with bones, 168 mg per 3 oz (85 g) serving. Canned shrimp, 125 mg per 3 oz (85 g) serving. Baked beans, 120 mg per 1 cup (266 g) serving. Tofu, firm, made with calcium sulfate, 861 mg per  cup (126 g) serving. Dairy Yogurt, plain, low-fat, 448 mg per 1 cup (245 g) serving Nonfat milk, 300 mg per 1 cup (245 g) serving. American cheese, 145 mg per 1 oz (21 g) serving or 1 slice. Cheddar cheese, 200 mg per 1 oz (28 g) serving or 1 slice. Cottage cheese 2%, 125 mg per  cup (113 g) serving. Fortified soy, rice, or almond milk, 300 mg per 1 cup (237 mL) serving. Mozzarella, part skim, 210 mg per 1 oz (21 g) serving. The items listed  above may not be a complete list of foods high in calcium. Actual amounts of calcium may be different depending on processing. Contact a dietitian for more information. What foods are lower in calcium? Foods that are lower in calcium contain 50 mg or less per serving. Fruits Apple, 1 medium, about 6 mg. Banana, 1 medium, about 12 mg. Vegetables Lettuce, 19 mg per 1 cup (35 g) serving. Tomato, 1 small, about 11 mg. Grains Rice, white, 8 mg per  cup (79 g) serving. Boiled  potatoes, 14 mg per 1 cup (160 g) serving. White bread, 6 mg per slice. Meats and other proteins Egg, 24 mg per 1 egg (50 g). Red meat, 7 mg per 4 oz (80 g) serving. Chicken, 17 mg per 4 oz (113 g) serving. Fish, cod, or trout, 20 mg per 4 oz (140 g) serving. Dairy Cream cheese, regular, 14 mg per 1 Tbsp (15 g) serving. Brie cheese, 50 mg per 1 oz (32 g) serving. The items listed above may not be a complete list of foods lower in calcium. Actual amounts of calcium may be different depending on processing. Contact a dietitian for more information. This information is not intended to replace advice given to you by your health care provider. Make sure you discuss any questions you have with your health care provider. Document Revised: 07/13/2023 Document Reviewed: 07/13/2023 Elsevier Patient Education  2024 Else[employer] Inc.  EXERCISE AND DIET:  We recommended that you start or continue a regular exercise program for good health. Regular exercise means any activity that makes your heart beat faster and makes you sweat.  We recommend exercising at least 30 minutes per day at least 3 days a week, preferably 4 or 5.  We also recommend a diet low in fat and sugar.  Inactivity, poor dietary choices and obesity can cause diabetes, heart attack, stroke, and kidney damage, among others.    ALCOHOL AND SMOKING:  Women should limit their alcohol intake to no more than 7 drinks/beers/glasses of wine (combined, not each!) per week. Moderation of alcohol intake to this level decreases your risk of breast cancer and liver damage. And of course, no recreational drugs are part of a healthy lifestyle.  And absolutely no smoking or even second hand smoke. Most people know smoking can cause heart and lung diseases, but did you know it also contributes to weakening of your bones? Aging of your skin?  Yellowing of your teeth and nails?  CALCIUM AND VITAMIN D:  Adequate intake of calcium and Vitamin D are recommended.  The  recommendations for exact amounts of these supplements seem to change often, but generally speaking 600 mg of calcium (either carbonate or citrate) and 800 units of Vitamin D per day seems prudent. Certain women may benefit from higher intake of Vitamin D.  If you are among these women, your doctor will have told you during your visit.    PAP SMEARS:  Pap smears, to check for cervical cancer or precancers,  have traditionally been done yearly, although recent scientific advances have shown that most women can have pap smears less often.  However, every woman still should have a physical exam from her gynecologist every year. It will include a breast check, inspection of the vulva and vagina to check for abnormal growths or skin changes, a visual exam of the cervix, and then an exam to evaluate the size and shape of the uterus and ovaries.  And after 59 years of age, a rectal exam  is indicated to check for rectal cancers. We will also provide age appropriate advice regarding health maintenance, like when you should have certain vaccines, screening for sexually transmitted diseases, bone density testing, colonoscopy, mammograms, etc.   MAMMOGRAMS:  All women over 11 years old should have a yearly mammogram. Many facilities now offer a "3D" mammogram, which may cost around $50 extra out of pocket. If possible,  we recommend you accept the option to have the 3D mammogram performed.  It both reduces the number of women who will be called back for extra views which then turn out to be normal, and it is better than the routine mammogram at detecting truly abnormal areas.    COLONOSCOPY:  Colonoscopy to screen for colon cancer is recommended for all women at age 8.  We know, you hate the idea of the prep.  We agree, BUT, having colon cancer and not knowing it is worse!!  Colon cancer so often starts as a polyp that can be seen and removed at colonscopy, which can quite literally save your life!  And if your first  colonoscopy is normal and you have no family history of colon cancer, most women don't have to have it again for 10 years.  Once every ten years, you can do something that may end up saving your life, right?  We will be happy to help you get it scheduled when you are ready.  Be sure to check your insurance coverage so you understand how much it will cost.  It may be covered as a preventative service at no cost, but you should check your particular policy.

## 2024-06-08 NOTE — Addendum Note (Signed)
 Addended by: VICCI ARVIN PARAS on: 06/08/2024 01:57 PM   Modules accepted: Orders

## 2024-06-08 NOTE — Telephone Encounter (Signed)
 Please assist in ordering labs from [facility].   I collected a Nuswab for gonorrhea, chlamydia, and trichomonas testing.   Patient will go to [facility] on [address] this week to do testing for HIV, syphilis, and hepatitis C.   Codes are STD screening, HIV screening, and viral screening.

## 2024-06-10 LAB — STI PROFILE, CT/NG/TV
Chlamydia by NAA: NEGATIVE
Gonococcus by NAA: NEGATIVE
Trich vag by NAA: NEGATIVE

## 2024-06-11 ENCOUNTER — Ambulatory Visit: Payer: Self-pay | Admitting: Obstetrics and Gynecology

## 2024-06-17 ENCOUNTER — Telehealth: Payer: Self-pay | Admitting: *Deleted

## 2024-06-17 DIAGNOSIS — Z114 Encounter for screening for human immunodeficiency virus [HIV]: Secondary | ICD-10-CM

## 2024-06-17 DIAGNOSIS — Z1159 Encounter for screening for other viral diseases: Secondary | ICD-10-CM

## 2024-06-17 DIAGNOSIS — Z113 Encounter for screening for infections with a predominantly sexual mode of transmission: Secondary | ICD-10-CM

## 2024-06-17 NOTE — Telephone Encounter (Signed)
 Patient left message on triage line stating she was going to lave labs drawn at [facility] on 8/22 at 0730.   Per review of EPIC, patient has future lab orders for [facility]: HIV, RPR and Hep C.  Labs order released.   Call returned to patient, left detailed message, ok per dpr. Left detailed message advising 3 labs ordered previously, they have been released to [facility]. Return call if any additional questions.   Routing to provider for final review. Will close encounter.

## 2024-06-20 LAB — RPR: RPR Ser Ql: NONREACTIVE

## 2024-06-20 LAB — HIV ANTIBODY (ROUTINE TESTING W REFLEX): HIV Screen 4th Generation wRfx: NONREACTIVE

## 2024-06-20 LAB — HEPATITIS C ANTIBODY: Hep C Virus Ab: NONREACTIVE

## 2025-06-14 ENCOUNTER — Ambulatory Visit: Admitting: Obstetrics and Gynecology
